# Patient Record
Sex: Male | Born: 1990 | Race: White | Hispanic: No | Marital: Married | Smoking: Current every day smoker
Health system: Southern US, Community
[De-identification: ages and names within clinical notes are randomized; demographics above are authoritative.]

## PROBLEM LIST (undated history)

## (undated) DIAGNOSIS — F913 Oppositional defiant disorder: Secondary | ICD-10-CM

## (undated) DIAGNOSIS — F988 Other specified behavioral and emotional disorders with onset usually occurring in childhood and adolescence: Secondary | ICD-10-CM

## (undated) HISTORY — PX: WRIST ARTHROPLASTY: SHX1088

---
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
## 2012-04-19 ENCOUNTER — Ambulatory Visit: Payer: Self-pay | Admitting: Family Medicine

## 2013-07-12 ENCOUNTER — Emergency Department: Payer: Self-pay | Admitting: Emergency Medicine

## 2014-10-03 ENCOUNTER — Emergency Department: Payer: Self-pay

## 2014-10-03 ENCOUNTER — Emergency Department
Admission: EM | Admit: 2014-10-03 | Discharge: 2014-10-03 | Disposition: A | Discharge status: Home / Self Care | Payer: Self-pay | Attending: Emergency Medicine | Admitting: Emergency Medicine

## 2014-10-03 ENCOUNTER — Encounter: Payer: Self-pay | Admitting: Emergency Medicine

## 2014-10-03 DIAGNOSIS — Z720 Tobacco use: Secondary | ICD-10-CM | POA: Insufficient documentation

## 2014-10-03 DIAGNOSIS — Y990 Civilian activity done for income or pay: Secondary | ICD-10-CM | POA: Insufficient documentation

## 2014-10-03 DIAGNOSIS — W11XXXA Fall on and from ladder, initial encounter: Secondary | ICD-10-CM | POA: Insufficient documentation

## 2014-10-03 DIAGNOSIS — S93402A Sprain of unspecified ligament of left ankle, initial encounter: Secondary | ICD-10-CM | POA: Insufficient documentation

## 2014-10-03 DIAGNOSIS — Y9389 Activity, other specified: Secondary | ICD-10-CM | POA: Insufficient documentation

## 2014-10-03 DIAGNOSIS — Y9289 Other specified places as the place of occurrence of the external cause: Secondary | ICD-10-CM | POA: Insufficient documentation

## 2014-10-03 MED ORDER — KETOROLAC TROMETHAMINE 10 MG PO TABS
ORAL_TABLET | ORAL | Status: AC
  Filled 2014-10-03: qty 1

## 2014-10-03 MED ORDER — IBUPROFEN 800 MG PO TABS: 800.0000 mg | ORAL_TABLET | Freq: Three times a day (TID) | ORAL | Status: DC | PRN

## 2014-10-03 MED ORDER — TRAMADOL HCL 50 MG PO TABS: 50.0000 mg | ORAL_TABLET | Freq: Four times a day (QID) | ORAL | Status: DC | PRN

## 2014-10-03 MED ORDER — KETOROLAC TROMETHAMINE 10 MG PO TABS
10.0000 mg | ORAL_TABLET | Freq: Once | ORAL | Status: AC
  Administered 2014-10-03: 10 mg via ORAL

## 2014-10-03 NOTE — ED Provider Notes (Signed)
[facility] Emergency Department Provider Note  ____________________________________________  Time seen: 1238  I have reviewed the triage vital signs and the nursing notes.   HISTORY  Chief Complaint Ankle Pain    HPI Dave Rivera is a 24 y.o. male arrives here today with left ankle painstates that he fell off a ladder where he stepped down one step to many while at work stepped into a ditch when he came down twisting his ankle felt a crack and a pop continue to work on it now it's swollen and bruised rates his pain as approximately a 5 out of 10 worse with any type of weightbearing or movement relieved by keeping it elevated denies numbness tingling or weakness otherwise has no complaints at this time is here today for further evaluation and treatment   History reviewed. No pertinent past medical history.  There are no active problems to display for this patient.   Past Surgical History  Procedure Laterality Date  . Wrist arthroplasty      Current Outpatient Rx  Name  Route  Sig  Dispense  Refill  . ibuprofen (ADVIL,MOTRIN) 800 MG tablet   Oral   Take 1 tablet (800 mg total) by mouth every 8 (eight) hours as needed.   30 tablet   0   . traMADol (ULTRAM) 50 MG tablet   Oral   Take 1 tablet (50 mg total) by mouth every 6 (six) hours as needed for moderate pain or severe pain.   12 tablet   0     Allergies Review of patient's allergies indicates no known allergies.  History reviewed. No pertinent family history.  Social History History  Substance Use Topics  . Smoking status: Current Every Day Smoker  . Smokeless tobacco: Not on file  . Alcohol Use: No    Review of Systems Constitutional: No fever/chills Eyes: No visual changes. ENT: No sore throat. Cardiovascular: Denies chest pain. Respiratory: Denies shortness of breath. Gastrointestinal: No abdominal pain.  No nausea, no vomiting.  No diarrhea.  No  constipation. Genitourinary: Negative for dysuria. Musculoskeletal: Negative for back pain. Skin: Negative for rash. Neurological: Negative for headaches, focal weakness or numbness.  10-point ROS otherwise negative.  ____________________________________________   PHYSICAL EXAM:  VITAL SIGNS: ED Triage Vitals  Enc Vitals Group     BP 10/03/14 1153 114/66 mmHg     Pulse --      Resp 10/03/14 1153 16     Temp 10/03/14 1153 97.9 F (36.6 C)     Temp Source 10/03/14 1153 Oral     SpO2 10/03/14 1153 98 %     Weight 10/03/14 1153 200 lb (90.719 kg)     Height 10/03/14 1153 6' (1.829 m)     Head Cir --      Peak Flow --      Pain Score 10/03/14 1154 5     Pain Loc --      Pain Edu? --      Excl. in GC? --     Constitutional: Alert and oriented. Well appearing and in no acute distress. Eyes: Conjunctivae are normal. PERRL. EOMI. Head: Atraumatic. Nose: No congestion/rhinnorhea. Mouth/Throat: Mucous membranes are moist.  Oropharynx non-erythematous. Neck: No stridor.   Cardiovascular: Normal rate, regular rhythm. Grossly normal heart sounds.  Good peripheral circulation. Respiratory: Normal respiratory effort.  No retractions. Lungs CTAB. Musculoskeletal: Tenderness with palpation over his lateral malleolus of his left ankle bruising below that mild swelling across the  top of his left foot he has good distal pulses good cap refill Achilles is intact Neurologic:  Normal speech and language. No gross focal neurologic deficits are appreciated. Speech is normal. No gait instability. Skin:  Skin is warm, dry and intact. No rash noted. Psychiatric: Mood and affect are normal. Speech and behavior are normal.  ____________________________________________    RADIOLOGY  IMPRESSION: No acute osseous abnormalities.   Electronically Signed By: Ulyses Southward M.D. On: 10/03/2014 13:26  I, Liesa Tsan,  Leelynd Maldonado, Kristine Garbe, personally viewed and evaluated these images as part of my medical  decision making.  ____________________________________________   PROCEDURES  Procedure(s) performed: Ace wrap and stirrup splint was applied to the patient's ankle  Critical Care performed: No  ____________________________________________   INITIAL IMPRESSION / ASSESSMENT AND PLAN / ED COURSE  Pertinent labs & imaging results that were available during my care of the patient were reviewed by me and considered in my medical decision making (see chart for details).  Initial impression left ankle sprain patient recommend [facility] follow-up with orthopedics in 1 week if he has continued pain return here for any acute concerns or worsening symptoms ____________________________________________   FINAL CLINICAL IMPRESSION(S) / ED DIAGNOSES  Final diagnoses:  Ankle sprain, left, initial encounter     Jacen Carlini Rosalyn Gess, PA-C 10/03/14 1358  Emily Filbert, MD 10/03/14 1426

## 2014-10-03 NOTE — Discharge Instructions (Signed)

## 2014-10-03 NOTE — ED Notes (Signed)
Reports twisting ankle at work 2 days ago.  States he was at work but does not want to file WC.

## 2015-02-10 ENCOUNTER — Emergency Department
Admission: EM | Admit: 2015-02-10 | Discharge: 2015-02-10 | Disposition: A | Discharge status: Home / Self Care | Payer: Self-pay | Attending: Emergency Medicine | Admitting: Emergency Medicine

## 2015-02-10 ENCOUNTER — Encounter: Payer: Self-pay | Admitting: Emergency Medicine

## 2015-02-10 DIAGNOSIS — K644 Residual hemorrhoidal skin tags: Secondary | ICD-10-CM | POA: Insufficient documentation

## 2015-02-10 DIAGNOSIS — Z79899 Other long term (current) drug therapy: Secondary | ICD-10-CM | POA: Insufficient documentation

## 2015-02-10 DIAGNOSIS — Z720 Tobacco use: Secondary | ICD-10-CM | POA: Insufficient documentation

## 2015-02-10 DIAGNOSIS — K625 Hemorrhage of anus and rectum: Secondary | ICD-10-CM

## 2015-02-10 LAB — URINALYSIS COMPLETE WITH MICROSCOPIC (ARMC ONLY)
Bacteria, UA: NONE SEEN
Bilirubin Urine: NEGATIVE
GLUCOSE, UA: NEGATIVE mg/dL
Hgb urine dipstick: NEGATIVE
KETONES UR: NEGATIVE mg/dL
Leukocytes, UA: NEGATIVE
Nitrite: NEGATIVE
Protein, ur: 100 mg/dL — AB
SQUAMOUS EPITHELIAL / LPF: NONE SEEN
Specific Gravity, Urine: 1.02 (ref 1.005–1.030)
pH: 9 — ABNORMAL HIGH (ref 5.0–8.0)

## 2015-02-10 LAB — COMPREHENSIVE METABOLIC PANEL
ALK PHOS: 80 U/L (ref 38–126)
ALT: 15 U/L — ABNORMAL LOW (ref 17–63)
AST: 21 U/L (ref 15–41)
Albumin: 4.6 g/dL (ref 3.5–5.0)
Anion gap: 8 (ref 5–15)
BUN: 10 mg/dL (ref 6–20)
CALCIUM: 9.1 mg/dL (ref 8.9–10.3)
CO2: 27 mmol/L (ref 22–32)
Chloride: 104 mmol/L (ref 101–111)
Creatinine, Ser: 0.7 mg/dL (ref 0.61–1.24)
GFR calc non Af Amer: >60 mL/min (ref >60)
Glucose, Bld: 133 mg/dL — ABNORMAL HIGH (ref 65–99)
Potassium: 3.9 mmol/L (ref 3.5–5.1)
Sodium: 139 mmol/L (ref 135–145)
Total Bilirubin: 0.8 mg/dL (ref 0.3–1.2)
Total Protein: 7.9 g/dL (ref 6.5–8.1)

## 2015-02-10 LAB — CBC
HCT: 45.5 % (ref 40.0–52.0)
Hemoglobin: 15.3 g/dL (ref 13.0–18.0)
MCH: 29.6 pg (ref 26.0–34.0)
MCHC: 33.6 g/dL (ref 32.0–36.0)
MCV: 88.2 fL (ref 80.0–100.0)
PLATELETS: 269 10*3/uL (ref 150–440)
RBC: 5.16 MIL/uL (ref 4.40–5.90)
RDW: 12.9 % (ref 11.5–14.5)
WBC: 11.5 10*3/uL — ABNORMAL HIGH (ref 3.8–10.6)

## 2015-02-10 LAB — LIPASE, BLOOD: Lipase: 51 U/L (ref 11–51)

## 2015-02-10 MED ORDER — DOCUSATE SODIUM 100 MG PO CAPS: 200.0000 mg | ORAL_CAPSULE | Freq: Two times a day (BID) | ORAL | Status: DC

## 2015-02-10 NOTE — ED Notes (Signed)
Yesterday had normal bm, last pm developed pain low abd pain, vomited several times,had episode of rectal bleeding,

## 2015-02-10 NOTE — Discharge Instructions (Signed)
°Gastrointestinal Bleeding °Gastrointestinal (GI) bleeding means there is bleeding somewhere along the digestive tract, between the mouth and anus. °CAUSES  °There are many different problems that can cause GI bleeding. Possible causes include: °· Esophagitis. This is inflammation, irritation, or swelling of the esophagus. °· Hemorrhoids. These are veins that are full of blood (engorged) in the rectum. They cause pain, inflammation, and may bleed. °· Anal fissures. These are areas of painful tearing which may bleed. They are often caused by passing hard stool. °· Diverticulosis. These are pouches that form on the colon over time, with age, and may bleed significantly. °· Diverticulitis. This is inflammation in areas with diverticulosis. It can cause pain, fever, and bloody stools, although bleeding is rare. °· Polyps and cancer. Colon cancer often starts out as precancerous polyps. °· Gastritis and ulcers. Bleeding from the upper gastrointestinal tract (near the stomach) may travel through the intestines and produce black, sometimes tarry, often bad smelling stools. In certain cases, if the bleeding is fast enough, the stools may not be black, but red. This condition may be life-threatening. °SYMPTOMS  °· Vomiting bright red blood or material that looks like coffee grounds. °· Bloody, black, or tarry stools. °DIAGNOSIS  °Your caregiver may diagnose your condition by taking your history and performing a physical exam. More tests may be needed, including: °· X-rays and other imaging tests. °· Esophagogastroduodenoscopy (EGD). This test uses a flexible, lighted tube to look at your esophagus, stomach, and small intestine. °· Colonoscopy. This test uses a flexible, lighted tube to look at your colon. °TREATMENT  °Treatment depends on the cause of your bleeding.  °· For bleeding from the esophagus, stomach, small intestine, or colon, the caregiver doing your EGD or colonoscopy may be able to stop the bleeding as part of  the procedure. °· Inflammation or infection of the colon can be treated with medicines. °· Many rectal problems can be treated with creams, suppositories, or warm baths. °· Surgery is sometimes needed. °· Blood transfusions are sometimes needed if you have lost a lot of blood. °If bleeding is slow, you may be allowed to go home. If there is a lot of bleeding, you will need to stay in the hospital for observation. °HOME CARE INSTRUCTIONS  °· Take any medicines exactly as prescribed. °· Keep your stools soft by eating foods that are high in fiber. These foods include whole grains, legumes, fruits, and vegetables. Prunes (1 to 3 a day) work well for many people. °· Drink enough fluids to keep your urine clear or pale yellow. °SEEK IMMEDIATE MEDICAL CARE IF:  °· Your bleeding increases. °· You feel lightheaded, weak, or you faint. °· You have severe cramps in your back or abdomen. °· You pass large blood clots in your stool. °· Your problems are getting worse. °MAKE SURE YOU:  °· Understand these instructions. °· Will watch your condition. °· Will get help right away if you are not doing well or get worse. °  °This information is not intended to replace advice given to you by your health care provider. Make sure you discuss any questions you have with your health care provider. °  °Document Released: 03/26/2000 Document Revised: 03/15/2012 Document Reviewed: 09/16/2014 °Elsevier Interactive Patient Education ©2016 [employer] °Hemorrhoids °Hemorrhoids are swollen veins around the rectum or anus. There are two types of hemorrhoids:  °· Internal hemorrhoids. These occur in the veins just inside the rectum. They may poke through to the outside and become irritated and painful. °· External hemorrhoids. These occur in   the veins outside the anus and can be felt as a painful swelling or hard lump near the anus. °CAUSES °· Pregnancy.   °· Obesity.   °· Constipation or diarrhea.   °· Straining to have a bowel movement.    °· Sitting for long periods on the toilet. °· Heavy lifting or other activity that caused you to strain. °· Anal intercourse. °SYMPTOMS  °· Pain.   °· Anal itching or irritation.   °· Rectal bleeding.   °· Fecal leakage.   °· Anal swelling.   °· One or more lumps around the anus.   °DIAGNOSIS  °Your caregiver may be able to diagnose hemorrhoids by visual examination. Other examinations or tests that may be performed include:  °· Examination of the rectal area with a gloved hand (digital rectal exam).   °· Examination of anal canal using a small tube (scope).   °· A blood test if you have lost a significant amount of blood. °· A test to look inside the colon (sigmoidoscopy or colonoscopy). °TREATMENT °Most hemorrhoids can be treated at home. However, if symptoms do not seem to be getting better or if you have a lot of rectal bleeding, your caregiver may perform a procedure to help make the hemorrhoids get smaller or remove them completely. Possible treatments include:  °· Placing a rubber band at the base of the hemorrhoid to cut off the circulation (rubber band ligation).   °· Injecting a chemical to shrink the hemorrhoid (sclerotherapy).   °· Using a tool to burn the hemorrhoid (infrared light therapy).   °· Surgically removing the hemorrhoid (hemorrhoidectomy).   °· Stapling the hemorrhoid to block blood flow to the tissue (hemorrhoid stapling).   °HOME CARE INSTRUCTIONS  °· Eat foods with fiber, such as whole grains, beans, nuts, fruits, and vegetables. Ask your doctor about taking products with added fiber in them (fiber supplements). °· Increase fluid intake. Drink enough water and fluids to keep your urine clear or pale yellow.   °· Exercise regularly.   °· Go to the bathroom when you have the urge to have a bowel movement. Do not wait.   °· Avoid straining to have bowel movements.   °· Keep the anal area dry and clean. Use wet toilet paper or moist towelettes after a bowel movement.   °· Medicated creams  and suppositories may be used or applied as directed.   °· Only take over-the-counter or prescription medicines as directed by your caregiver.   °· Take warm sitz baths for 15-20 minutes, 3-4 times a day to ease pain and discomfort.   °· Place ice packs on the hemorrhoids if they are tender and swollen. Using ice packs between sitz baths may be helpful.   °· Put ice in a plastic bag.   °· Place a towel between your skin and the bag.   °· Leave the ice on for 15-20 minutes, 3-4 times a day.   °· Do not use a donut-shaped pillow or sit on the toilet for long periods. This increases blood pooling and pain.   °SEEK MEDICAL CARE IF: °· You have increasing pain and swelling that is not controlled by treatment or medicine. °· You have uncontrolled bleeding. °· You have difficulty or you are unable to have a bowel movement. °· You have pain or inflammation outside the area of the hemorrhoids. °MAKE SURE YOU: °· Understand these instructions. °· Will watch your condition. °· Will get help right away if you are not doing well or get worse. °  °This information is not intended to replace advice given to you by your health care provider. Make sure you discuss any questions you have   with your health care provider. °  °Document Released: 03/26/2000 Document Revised: 03/15/2012 Document Reviewed: 02/01/2012 °Elsevier Interactive Patient Education ©2016 [employer] ° °

## 2015-02-10 NOTE — ED Notes (Signed)
Pt to ed with c/o abd pain since last night,  Pt states last bm this am,  Reports vomiting last night.

## 2015-02-10 NOTE — ED Notes (Signed)
MD at bedside. 

## 2015-02-10 NOTE — ED Provider Notes (Signed)
[facility] Emergency Department Provider Note  ____________________________________________  Time seen: 9:30 AM  I have reviewed the triage vital signs and the nursing notes.   HISTORY  Chief Complaint Abdominal Pain    HPI Dave Mctavish. is a 23 y.o. male who reports being constipated recently. He had 3 large bowel movements yesterday, after which she started developing some crampy lower abdominal pain. He also was nauseated and vomited 2-3 times without blood in it. After passing several hard bowel movements, he also noted that he had a small amount of red blood on the toilet paper and in the toilet bowl. He has had a history of hemorrhoids before.no significant intra-abdominal surgeries in the past.     History reviewed. No pertinent past medical history.   There are no active problems to display for this patient.    Past Surgical History  Procedure Laterality Date  . Wrist arthroplasty       Current Outpatient Rx  Name  Route  Sig  Dispense  Refill  . docusate sodium (COLACE) 100 MG capsule   Oral   Take 2 capsules (200 mg total) by mouth 2 (two) times daily.   120 capsule   0   . ibuprofen (ADVIL,MOTRIN) 800 MG tablet   Oral   Take 1 tablet (800 mg total) by mouth every 8 (eight) hours as needed. Patient not taking: Reported on 02/10/2015   30 tablet   0   . traMADol (ULTRAM) 50 MG tablet   Oral   Take 1 tablet (50 mg total) by mouth every 6 (six) hours as needed for moderate pain or severe pain. Patient not taking: Reported on 02/10/2015   12 tablet   0      Allergies Review of patient's allergies indicates no known allergies.   History reviewed. No pertinent family history.  Social History Social History  Substance Use Topics  . Smoking status: Current Every Day Smoker  . Smokeless tobacco: None  . Alcohol Use: No    Review of Systems  Constitutional:   No fever or chills. No weight changes Eyes:   No  blurry vision or double vision.  ENT:   No sore throat. Cardiovascular:   No chest pain. Respiratory:   No dyspnea or cough. Gastrointestinal:  Abdominal pain with vomiting as above, no diarrhea. Positive bright red blood per rectum  no melena. Genitourinary:   Negative for dysuria, urinary retention, bloody urine, or difficulty urinating. Musculoskeletal:   Negative for back pain. No joint swelling or pain. Skin:   Negative for rash. Neurological:   Negative for headaches, focal weakness or numbness. Psychiatric:  No anxiety or depression.   Endocrine:  No hot/cold intolerance, changes in energy, or sleep difficulty.  10-point ROS otherwise negative.  ____________________________________________   PHYSICAL EXAM:  VITAL SIGNS: ED Triage Vitals  Enc Vitals Group     BP 02/10/15 0920 116/71 mmHg     Pulse Rate 02/10/15 0920 79     Resp 02/10/15 0920 18     Temp 02/10/15 0920 98.3 F (36.8 C)     Temp src --      SpO2 02/10/15 0920 97 %     Weight 02/10/15 0917 200 lb (90.719 kg)     Height 02/10/15 0917  (1.854 m)     Head Cir --      Peak Flow --      Pain Score 02/10/15 0917 0     Pain Loc --  Pain Edu? --      Excl. in GC? --      Constitutional:   Alert and oriented. Well appearing and in no distress. Eyes:   No scleral icterus. No conjunctival pallor. PERRL. EOMI ENT   Head:   Normocephalic and atraumatic.   Nose:   No congestion/rhinnorhea. No septal hematoma   Mouth/Throat:   MMM, no pharyngeal erythema. No peritonsillar mass. No uvula shift.   Neck:   No stridor. No SubQ emphysema. No meningismus. Hematological/Lymphatic/Immunilogical:   No cervical lymphadenopathy. Cardiovascular:   RRR. Normal and symmetric distal pulses are present in all extremities. No murmurs, rubs, or gallops. Respiratory:   Normal respiratory effort without tachypnea nor retractions. Breath sounds are clear and equal bilaterally. No  wheezes/rales/rhonchi. Gastrointestinal:   Soft and nontender. No distention. There is no CVA tenderness.  No rebound, rigidity, or guarding. Rectal exam is normal externally. On digital rectal exam there is palpable external hemorrhoid that is not tender. Prostate unremarkable. Exam produces only secretions that are trace positive for Hemoccult Genitourinary:   deferred Musculoskeletal:   Nontender with normal range of motion in all extremities. No joint effusions.  No lower extremity tenderness.  No edema. Neurologic:   Normal speech and language.  CN 2-10 normal. Motor grossly intact. No pronator drift.  Normal gait. No gross focal neurologic deficits are appreciated.  Skin:    Skin is warm, dry and intact. No rash noted.  No petechiae, purpura, or bullae. Psychiatric:   Mood and affect are normal. Speech and behavior are normal. Patient exhibits appropriate insight and judgment.  ____________________________________________    LABS (pertinent positives/negatives) (all labs ordered are listed, but only abnormal results are displayed) Labs Reviewed  COMPREHENSIVE METABOLIC PANEL - Abnormal; Notable for the following:    Glucose, Bld 133 (*)    ALT 15 (*)    All other components within normal limits  CBC - Abnormal; Notable for the following:    WBC 11.5 (*)    All other components within normal limits  URINALYSIS COMPLETEWITH MICROSCOPIC ([facility] ONLY) - Abnormal; Notable for the following:    Color, Urine YELLOW (*)    APPearance TURBID (*)    pH 9.0 (*)    Protein, ur 100 (*)    All other components within normal limits  LIPASE, BLOOD   ____________________________________________   EKG    ____________________________________________    RADIOLOGY    ____________________________________________   PROCEDURES   ____________________________________________   INITIAL IMPRESSION / ASSESSMENT AND PLAN / ED COURSE  Pertinent labs & imaging results that were  available during my care of the patient were reviewed by me and considered in my medical decision making (see chart for details).  Patient presents with abdominal pain and passing some bright red blood per rectum after having several large hard bowel movements. He does appear to have an external hemorrhoid which likely is abraded and bleeding after the mechanical trauma from large bowel movements. We'll keep him on Colace for stool softening and have him take sitz baths and do conservative hemorrhoid management, and I checked his symptoms were resolved. If not his been counseled to follow up with GI for further evaluation. Labs unremarkable, vital signs normal.     ____________________________________________   FINAL CLINICAL IMPRESSION(S) / ED DIAGNOSES  Final diagnoses:  External hemorrhoid  Rectal bleed      Sharman CheekPhillip Breckin [place], MD 02/10/15 1112

## 2015-03-20 ENCOUNTER — Encounter: Payer: Self-pay | Admitting: Emergency Medicine

## 2015-03-20 ENCOUNTER — Ambulatory Visit
Admission: EM | Admit: 2015-03-20 | Discharge: 2015-03-20 | Disposition: A | Discharge status: Home / Self Care | Payer: Self-pay | Attending: Family Medicine | Admitting: Family Medicine

## 2015-03-20 DIAGNOSIS — J069 Acute upper respiratory infection, unspecified: Secondary | ICD-10-CM

## 2015-03-20 DIAGNOSIS — R040 Epistaxis: Secondary | ICD-10-CM

## 2015-03-20 MED ORDER — LORATADINE-PSEUDOEPHEDRINE ER 5-120 MG PO TB12: 1.0000 | ORAL_TABLET | Freq: Two times a day (BID) | ORAL | Status: DC

## 2015-03-20 NOTE — Discharge Instructions (Signed)
take medication as prescribed. Rest. Drink plenty of fluids. Take over-the-counter Tylenol or ibuprofen as needed.  Follow-up closely with her primary care physician or the above. Return to urgent care or proceed to ER for new or worsening concerns.  Upper Respiratory Infection, Adult Most upper respiratory infections (URIs) are caused by a virus. A URI affects the nose, throat, and upper air passages. The most common type of URI is often called "the common cold." HOME CARE   Take medicines only as told by your doctor.  Gargle warm saltwater or take cough drops to comfort your throat as told by your doctor.  Use a warm mist humidifier or inhale steam from a shower to increase air moisture. This may make it easier to breathe.  Drink enough fluid to keep your pee (urine) clear or pale yellow.  Eat soups and other clear broths.  Have a healthy diet.  Rest as needed.  Go back to work when your fever is gone or your doctor says it is okay.  You may need to stay home longer to avoid giving your URI to others.  You can also wear a face mask and wash your hands often to prevent spread of the virus.  Use your inhaler more if you have asthma.  Do not use any tobacco products, including cigarettes, chewing tobacco, or electronic cigarettes. If you need help quitting, ask your doctor. GET HELP IF:  You are getting worse, not better.  Your symptoms are not helped by medicine.  You have chills.  You are getting more short of breath.  You have brown or red mucus.  You have yellow or brown discharge from your nose.  You have pain in your face, especially when you bend forward.  You have a fever.  You have puffy (swollen) neck glands.  You have pain while swallowing.  You have white areas in the back of your throat. GET HELP RIGHT AWAY IF:   You have very bad or constant:  Headache.  Ear pain.  Pain in your forehead, behind your eyes, and over your cheekbones (sinus  pain).  Chest pain.  You have long-lasting (chronic) lung disease and any of the following:  Wheezing.  Long-lasting cough.  Coughing up blood.  A change in your usual mucus.  You have a stiff neck.  You have changes in your:  Vision.  Hearing.  Thinking.  Mood. MAKE SURE YOU:   Understand these instructions.  Will watch your condition.  Will get help right away if you are not doing well or get worse.   This information is not intended to replace advice given to you by your health care provider. Make sure you discuss any questions you have with your health care provider.   Document Released: 09/15/2007 Document Revised: 08/13/2014 Document Reviewed: 07/04/2013 Elsevier Interactive Patient Education 2016 [employer].  Nosebleed Nosebleeds are common. They are due to a crack in the inside lining of your nose (mucous membrane) or from a small blood vessel that starts to bleed. Nosebleeds can be caused by many conditions, such as injury, infections, dry mucous membranes or dry climate, medicines, nose picking, and home heating and cooling systems. Most nosebleeds come from blood vessels in the front of your nose. HOME CARE INSTRUCTIONS   Try controlling your nosebleed by pinching your nostrils gently and continuously for at least 10 minutes.  Avoid blowing or sniffing your nose for a number of hours after having a nosebleed.  Do not put gauze inside  your nose yourself. If your nose was packed by your health care provider, try to maintain the pack inside of your nose until your health care provider removes it.  If a gauze pack was used and it starts to fall out, gently replace it or cut off the end of it.  If a balloon catheter was used to pack your nose, do not cut or remove it unless your health care provider has instructed you to do that.  Avoid lying down while you are having a nosebleed. Sit up and lean forward.  Use a nasal spray decongestant to help with a  nosebleed as directed by your health care provider.  Do not use petroleum jelly or mineral oil in your nose. These can drip into your lungs.  Maintain humidity in your home by using less air conditioning or by using a humidifier.  Aspirinand blood thinners make bleeding more likely. If you are prescribed these medicines and you suffer from nosebleeds, ask your health care provider if you should stop taking the medicines or adjust the dose. Do not stop medicines unless directed by your health care provider  Resume your normal activities as you are able, but avoid straining, lifting, or bending at the waist for several days.  If your nosebleed was caused by dry mucous membranes, use over-the-counter saline nasal spray or gel. This will keep the mucous membranes moist and allow them to heal. If you must use a lubricant, choose the water-soluble variety. Use it only sparingly, and do not use it within several hours of lying down.  Keep all follow-up visits as directed by your health care provider. This is important. SEEK MEDICAL CARE IF:  You have a fever.  You get frequent nosebleeds.  You are getting nosebleeds more often. SEEK IMMEDIATE MEDICAL CARE IF:  Your nosebleed lasts longer than 20 minutes.  Your nosebleed occurs after an injury to your face, and your nose looks crooked or broken.  You have unusual bleeding from other parts of your body.  You have unusual bruising on other parts of your body.  You feel light-headed or you faint.  You become sweaty.  You vomit blood.  Your nosebleed occurs after a head injury.   This information is not intended to replace advice given to you by your health care provider. Make sure you discuss any questions you have with your health care provider.   Document Released: 01/06/2005 Document Revised: 04/19/2014 Document Reviewed: 11/12/2013 Elsevier Interactive Patient Education [employer] Elsevier Inc.

## 2015-03-20 NOTE — ED Provider Notes (Signed)
[facility]  ____________________________________________  Time seen: Approximately 1740PM  I have reviewed the triage vital signs and the nursing notes.   HISTORY  Chief Complaint Facial Pain   HPI Dave NettleJames W Alberty Jr. is a 24 y.o. male presents for complaints of 2 days of runny nose, nasal congestion. Patient states that he took over-the-counter Sudafed once this morning and states that this fully resolved his complaints.. Patient states that he thinks he had a fever this morning as he felt very warm and states that he checked his fever and was 101 orally. Patient reports occasional cough. Patient states that he continues to eat and drink fluids well. Denies sore throat. Denies chest pain, shortness of breath, wheezing, abdominal pain, neck pain, back pain, facial pain, headache, dizziness.  Patient reports that he came in tonight because he needed a work note as he called out of work [employer]. Patient states that overall he feels fine but states he needs a work note so he does not get fired.  Patient also reports that he wants his nose to be checked. 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 Patient states that he was wearing safety glasses. Denies eye involvement, eye pain, foreign body sensation, vision changes. Patient states that he leaned over and a piece of a stick went directly into his right nare. Patient states that he did have a small amount of a nosebleed immediately afterwards that quickly resolved. Patient reports that he wanted his nose checked to make sure there was no cut inside his nose. Denies other nasal bleeding. States no nasal bleeding when blowing nose. Denies other nasal bleeding. Denies other abnormal bleeding. Denies nasal or facial pain. Denies fall, head injury or loss of consciousness.   History reviewed. No pertinent past medical history.  There are no active problems to display for this patient.   Past Surgical History   Procedure Laterality Date  . Wrist arthroplasty      Current Outpatient Rx  Name  Route  Sig  Dispense  Refill  .           .           .           .             Allergies Review of patient's allergies indicates no known allergies.  No family history on file.  Social History Social History  Substance Use Topics  . Smoking status: Current Every Day Smoker  . Smokeless tobacco: None  . Alcohol Use: No    Reports tetanus immunization is up-to-date.  Review of Systems Constitutional: No fever/chills Eyes: No visual changes. ENT: No sore throat. Positive runny nose, nasal congestion and reports occasional cough. Reports posttraumatic nosebleed from yesterday. Cardiovascular: Denies chest pain. Respiratory: Denies shortness of breath. Gastrointestinal: No abdominal pain.  No nausea, no vomiting.  No diarrhea.  No constipation. Genitourinary: Negative for dysuria. Musculoskeletal: Negative for back pain. Skin: Negative for rash. Neurological: Negative for headaches, focal weakness or numbness.  10-point ROS otherwise negative.  ____________________________________________   PHYSICAL EXAM:  VITAL SIGNS: ED Triage Vitals  Enc Vitals Group     BP 03/20/15 1645 127/67 mmHg     Pulse Rate 03/20/15 1645 84     Resp 03/20/15 1645 18     Temp 03/20/15 1645 97.8 F (36.6 C)     Temp src -- oral     SpO2 03/20/15 1645 97 %  Weight 03/20/15 1645 210 lb (95.255 kg)     Height 03/20/15 1645  (1.854 m)     Head Cir --      Peak Flow --      Pain Score --      Pain Loc --      Pain Edu? --      Excl. in GC? --     Constitutional: Alert and oriented. Well appearing and in no acute distress. Eyes: Conjunctivae are normal. PERRL. EOMI. no pain with EOMs. Head: Atraumatic. No sinus tenderness to palpation. No swelling. No erythema. No facial tenderness, ecchymosis or signs of trauma.  Ears: no erythema, normal TMs bilaterally.   Nose: Mild clear nasal rhinorrhea.  Nontender. No active nosebleed, no visualized dry blood, no signs of recent bleeding or trauma visualized. No external nasal tenderness.  Mouth/Throat: Mucous membranes are moist.  Oropharynx non-erythematous. No tonsillar swelling or exudate. Neck: No stridor.  No cervical spine tenderness to palpation. Hematological/Lymphatic/Immunilogical: No cervical lymphadenopathy. Cardiovascular: Normal rate, regular rhythm. Grossly normal heart sounds.  Good peripheral circulation. Respiratory: Normal respiratory effort.  No retractions. Lungs CTAB. No wheezes, rales or rhonchi. Good air movement. Gastrointestinal: Soft and nontender. No distention. Normal Bowel sounds.   Musculoskeletal: No lower or upper extremity tenderness nor edema.   Neurologic:  Normal speech and language. No gross focal neurologic deficits are appreciated. No gait instability. Skin:  Skin is warm, dry and intact. No rash noted. Psychiatric: Mood and affect are normal. Speech and behavior are normal.  ____________________________________________   LABS (all labs ordered are listed, but only abnormal results are displayed)  Labs Reviewed - No data to display ____________________________________________  INITIAL IMPRESSION / ASSESSMENT AND PLAN / ED COURSE  Pertinent labs & imaging results that were available during my care of the patient were reviewed by me and considered in my medical decision making (see chart for details).  Very well-appearing patient. No acute distress. Presents for the complaints of 2 days of runny nose, nasal congestion and occasional cough. Very well-appearing patient. Lungs clear throughout. Abdomen soft and nontender. Patient also reports yesterday he had a small piece of a stick go into his right nare after accidentally leaned forward near stick, and reports that this caused a brief nosebleed and he wanted evaluated. No facial or nasal tenderness. Nares patent. No signs of active or recent nosebleed.  Counseled regarding monitoring and supportive treatments. Suspect viral respiratory infection. Will treat supportively with when necessary Claritin-D, over-the-counter Tylenol or ibuprofen, encourage fluids and rest. Work note for today and tomorrow given.  Discussed follow up with Primary care physician this week. Discussed follow up and return parameters including no resolution or any worsening concerns. Patient verbalized understanding and agreed to plan.   ____________________________________________   FINAL CLINICAL IMPRESSION(S) / ED DIAGNOSES  Final diagnoses:  Upper respiratory infection  Epistaxis       Renford Dills, NP 03/20/15 1925

## 2015-03-20 NOTE — ED Notes (Signed)
Sinus pain, fever 101 this morning. Possible abrasion in nose .

## 2015-04-09 ENCOUNTER — Ambulatory Visit
Admission: EM | Admit: 2015-04-09 | Discharge: 2015-04-09 | Disposition: A | Discharge status: Home / Self Care | Payer: Self-pay | Attending: Family Medicine | Admitting: Family Medicine

## 2015-04-09 DIAGNOSIS — J9801 Acute bronchospasm: Secondary | ICD-10-CM

## 2015-04-09 DIAGNOSIS — H6503 Acute serous otitis media, bilateral: Secondary | ICD-10-CM

## 2015-04-09 HISTORY — DX: Other specified behavioral and emotional disorders with onset usually occurring in childhood and adolescence: F98.8

## 2015-04-09 HISTORY — DX: Oppositional defiant disorder: F91.3

## 2015-04-09 MED ORDER — ALBUTEROL SULFATE HFA 108 (90 BASE) MCG/ACT IN AERS: 2.0000 | INHALATION_SPRAY | Freq: Four times a day (QID) | RESPIRATORY_TRACT | Status: DC | PRN

## 2015-04-09 MED ORDER — AMOXICILLIN 875 MG PO TABS: 875.0000 mg | ORAL_TABLET | Freq: Two times a day (BID) | ORAL | Status: DC

## 2015-04-09 NOTE — ED Provider Notes (Signed)
CSN: [id number]     Arrival date & time 04/09/15  1638 History   First MD Initiated Contact with Patient 04/09/15 1829     Chief Complaint  Patient presents with  . URI   (Consider location/radiation/quality/duration/timing/severity/associated sxs/prior Treatment) Patient is a 24 y.o. male presenting with URI. The history is provided by the patient.  URI Presenting symptoms: congestion, cough, ear pain and rhinorrhea   Severity:  Moderate Onset quality:  Sudden Duration:  1 day Timing:  Constant Progression:  Worsening Chronicity:  New Ineffective treatments:  None tried   Past Medical History  Diagnosis Date  . ADD (attention deficit disorder)   . ODD (oppositional defiant disorder)    Past Surgical History  Procedure Laterality Date  . Wrist arthroplasty     Family History  Problem Relation Age of Onset  . Lupus Father    Social History  Substance Use Topics  . Smoking status: Current Every Day Smoker -- 1.00 packs/day    Types: Cigarettes  . Smokeless tobacco: None  . Alcohol Use: No    Review of Systems  HENT: Positive for congestion, ear pain and rhinorrhea.   Respiratory: Positive for cough.     Allergies  Review of patient's allergies indicates no known allergies.  Home Medications   Prior to Admission medications   Medication Sig Start Date End Date Taking? Authorizing Provider  albuterol (PROVENTIL HFA;VENTOLIN HFA) 108 (90 Base) MCG/ACT inhaler Inhale 2 puffs into the lungs every 6 (six) hours as needed for wheezing or shortness of breath. 04/09/15   Payton Mccallumrlando Avonell Lenig, MD  amoxicillin (AMOXIL) 875 MG tablet Take 1 tablet (875 mg total) by mouth 2 (two) times daily. 04/09/15   Payton Mccallumrlando Laaibah Wartman, MD  docusate sodium (COLACE) 100 MG capsule Take 2 capsules (200 mg total) by mouth 2 (two) times daily. 02/10/15   Sharman CheekPhillip Stafford, MD  ibuprofen (ADVIL,MOTRIN) 800 MG tablet Take 1 tablet (800 mg total) by mouth every 8 (eight) hours as needed. 10/03/14   III  William C Ruffian, PA-C  loratadine-pseudoephedrine (CLARITIN-D 12 HOUR) 5-120 MG tablet Take 1 tablet by mouth 2 (two) times daily. 03/20/15   Renford DillsLindsey Miller, NP  traMADol (ULTRAM) 50 MG tablet Take 1 tablet (50 mg total) by mouth every 6 (six) hours as needed for moderate pain or severe pain. Patient not taking: Reported on 02/10/2015 10/03/14   III Rosalyn GessWilliam C Ruffian, PA-C   Meds Ordered and Administered this Visit  Medications - No data to display  BP 131/69 mmHg  Pulse 93  Temp(Src) 98.3 F (36.8 C) (Tympanic)  Resp 18  Ht 6' (1.829 m)  Wt 210 lb (95.255 kg)  BMI 28.47 kg/m2  SpO2 99% No data found.   Physical Exam  Constitutional: He appears well-developed and well-nourished. No distress.  HENT:  Head: Normocephalic and atraumatic.  Right Ear: External ear and ear canal normal. Tympanic membrane is erythematous and bulging.  Left Ear: External ear and ear canal normal. Tympanic membrane is erythematous and bulging.  Nose: Rhinorrhea present.  Mouth/Throat: Uvula is midline and mucous membranes are normal. Posterior oropharyngeal erythema present. No oropharyngeal exudate, posterior oropharyngeal edema or tonsillar abscesses.  Eyes: Conjunctivae and EOM are normal. Pupils are equal, round, and reactive to light. Right eye exhibits no discharge. Left eye exhibits no discharge. No scleral icterus.  Neck: Normal range of motion. Neck supple. No tracheal deviation present. No thyromegaly present.  Cardiovascular: Normal rate, regular rhythm and normal heart sounds.   Pulmonary/Chest: Effort normal  and breath sounds normal. No stridor. No respiratory distress. He has no wheezes. He has no rales. He exhibits no tenderness.  Lymphadenopathy:    He has no cervical adenopathy.  Neurological: He is alert.  Skin: Skin is warm and dry. No rash noted. He is not diaphoretic.  Nursing note and vitals reviewed.   ED Course  Procedures (including critical care time)  Labs Review Labs  Reviewed - No data to display  Imaging Review No results found.   Visual Acuity Review  Right Eye Distance:   Left Eye Distance:   Bilateral Distance:    Right Eye Near:   Left Eye Near:    Bilateral Near:         MDM   1. Bilateral acute serous otitis media, recurrence not specified   2. Bronchospasm    New Prescriptions   ALBUTEROL (PROVENTIL HFA;VENTOLIN HFA) 108 (90 BASE) MCG/ACT INHALER    Inhale 2 puffs into the lungs every 6 (six) hours as needed for wheezing or shortness of breath.   AMOXICILLIN (AMOXIL) 875 MG TABLET    Take 1 tablet (875 mg total) by mouth 2 (two) times daily.   1. diagnosis reviewed with patient 2. rx as per orders above; reviewed possible side effects, interactions, risks and benefits  3. Recommend supportive treatment with rest, otc analgesics, increased fluids 4. Follow-up prn if symptoms worsen or don't improve    Payton Mccallum, MD 04/09/15 [phone]

## 2015-04-09 NOTE — ED Notes (Signed)
Started yesterday with general body aches, productive cough. Bilateral ear pressure

## 2017-03-08 ENCOUNTER — Ambulatory Visit
Admission: EM | Admit: 2017-03-08 | Discharge: 2017-03-08 | Disposition: A | Discharge status: Home / Self Care | Payer: Self-pay | Attending: Emergency Medicine | Admitting: Emergency Medicine

## 2017-03-08 ENCOUNTER — Other Ambulatory Visit: Payer: Self-pay

## 2017-03-08 DIAGNOSIS — S0911XA Strain of muscle and tendon of head, initial encounter: Secondary | ICD-10-CM

## 2017-03-08 DIAGNOSIS — Z23 Encounter for immunization: Secondary | ICD-10-CM

## 2017-03-08 DIAGNOSIS — R6884 Jaw pain: Secondary | ICD-10-CM

## 2017-03-08 MED ORDER — TETANUS-DIPHTH-ACELL PERTUSSIS 5-2.5-18.5 LF-MCG/0.5 IM SUSP
0.5000 mL | Freq: Once | INTRAMUSCULAR | Status: AC
  Administered 2017-03-08: 0.5 mL via INTRAMUSCULAR

## 2017-03-08 MED ORDER — CYCLOBENZAPRINE HCL 10 MG PO TABS: 10.0000 mg | ORAL_TABLET | Freq: Every day | ORAL | 0 refills | Status: DC

## 2017-03-08 MED ORDER — IBUPROFEN 600 MG PO TABS: 600.0000 mg | ORAL_TABLET | Freq: Four times a day (QID) | ORAL | 0 refills | Status: DC | PRN

## 2017-03-08 NOTE — ED Provider Notes (Signed)
HPI  SUBJECTIVE:  Dave NettleJames W Durbin Jr. is a 26 y.o. male who presents with constant, dull, achy, throbbing, sore left-sided jaw pain starting this morning.  He states it feels like muscle pain.  He reports some swelling.  No swelling inferior to the jaw or along the angle of the jaw.  He states that he does grind his teeth badly at night.  No dental pain, fevers, sore throat.  No neck or ear pain, trismus.  No muscle cramping, stiffness.  There are no alleviating factors.  He has not tried anything for this.  Symptoms are worse with yawning, sneezing, turning his head to the right.  He states that he scraped himself against a rusty nail 3 weeks ago, and his tetanus is not up-to-date.  He denies puncture wound.  He is a marijuana smoker, former tobacco smoker.  He does not take any medications on a regular basis.  No history of diabetes, hypertension, sialolithiasis or sialoadentitis.    Past Medical History:  Diagnosis Date  . ADD (attention deficit disorder)   . ODD (oppositional defiant disorder)     Past Surgical History:  Procedure Laterality Date  . WRIST ARTHROPLASTY      Family History  Problem Relation Age of Onset  . Lupus Father     Social History   Tobacco Use  . Smoking status: Former Smoker    Packs/day: 1.00    Types: Cigarettes  . Smokeless tobacco: Never Used  Substance Use Topics  . Alcohol use: No  . Drug use: Yes    Types: Marijuana    Comment: last use today    No current facility-administered medications for this encounter.   Current Outpatient Medications:  .  cyclobenzaprine (FLEXERIL) 10 MG tablet, Take 1 tablet (10 mg total) by mouth at bedtime., Disp: 20 tablet, Rfl: 0 .  docusate sodium (COLACE) 100 MG capsule, Take 2 capsules (200 mg total) by mouth 2 (two) times daily., Disp: 120 capsule, Rfl: 0 .  ibuprofen (ADVIL,MOTRIN) 600 MG tablet, Take 1 tablet (600 mg total) by mouth every 6 (six) hours as needed., Disp: 30 tablet, Rfl: 0  No Known  Allergies   ROS  As noted in HPI.   Physical Exam  BP 134/74 (BP Location: Right Arm)   Pulse 80   Temp 98.2 F (36.8 C) (Oral)   Resp 16   Ht 6\' 1"  (1.854 m)   Wt 202 lb (91.6 kg)   SpO2 98%   BMI 26.65 kg/m   Constitutional: Well developed, well nourished, no acute distress Eyes:  EOMI, conjunctiva normal bilaterally HENT: Normocephalic, atraumatic,mucus membranes moist.  Positive tenderness along the left masseter muscle.  No appreciable swelling.  Dentition normal.  Gums normal.  No expressible purulent drainage from [facility] or Stenton's duct.  No tenderness along the left submandibular salivary gland, no tenderness over the parotid gland.  No trismus.  TMs normal.  No tenderness, crepitus over the TMJ Neck: Positive single cervical lymph node, no other submandibular or cervical lymphadenopathy. Respiratory: Normal inspiratory effort Cardiovascular: Normal rate GI: nondistended skin: No rash, skin intact Musculoskeletal: no deformities Neurologic: Alert & oriented x 3, no focal neuro deficits Psychiatric: Speech and behavior appropriate   ED Course   Medications  Tdap (BOOSTRIX) injection 0.5 mL (0.5 mLs Intramuscular Given 03/08/17 1448)    No orders of the defined types were placed in this encounter.   No results found for this or any previous visit (from the past 24 hour(s)).  No results found.  ED Clinical Impression  Jaw pain  Strain of masseter muscle, initial encounter   ED Assessment/Plan  Tetanus unlikely, denies any other muscle spasms or rigidity.  Will update tetanus however.  Presentation was consistent with a strain of the masseter muscle.  Patient has tenderness along the masseter muscle and he does grind his teeth at night.  He has no tenderness along the parotid gland.  He does have some submandibular tenderness, but no palpable submandibular salivary gland.  Could be an early sialolithiasis/sialoadenitis but symptoms started today.  Plan to  send home with Flexeril, ibuprofen 600 mg 1 g of Tylenol 3-4 times a day, soft diet, will order primary care referral.  He will follow-up here for any signs of sialadenitis/sialithiasis and will go to the ER for any fevers, worsening of symptoms or other concerns  Discussed MDM, plan and followup with patient. Discussed sn/sx that should prompt return to the ED. patient agrees with plan.   Meds ordered this encounter  Medications  . Tdap (BOOSTRIX) injection 0.5 mL  . cyclobenzaprine (FLEXERIL) 10 MG tablet    Sig: Take 1 tablet (10 mg total) by mouth at bedtime.    Dispense:  20 tablet    Refill:  0  . ibuprofen (ADVIL,MOTRIN) 600 MG tablet    Sig: Take 1 tablet (600 mg total) by mouth every 6 (six) hours as needed.    Dispense:  30 tablet    Refill:  0    *This clinic note was created using Scientist, clinical (histocompatibility and immunogenetics)Dragon dictation software. Therefore, there may be occasional mistakes despite careful proofreading.   ?   Domenick GongMortenson, Peri Kreft, MD 03/08/17 1517

## 2017-03-08 NOTE — Discharge Instructions (Signed)
600 mg of ibuprofen with 1 g of Tylenol 3-4 times a day as needed for pain.  Soft diet for the next several days.  Try some warm compresses.  I have given you information on salivary gland stones and infections so that you know what to look out for.  I do not think that you have this at this time, but it is still a possibility.  Return here for any other signs or symptoms.  Here is a list of primary care providers who are taking new patients:  Dr. Elizabeth Sauereanna Jones, Dr. Schuyler AmorWilliam Plonk [address] [place] [address] [phone]  [facility] [place] [address]  [place] [address]  [phone]  [facility] [address]  [place], [place] [address] [phone]  [facility] [place] [address]  [phone] [place], [place] [address]  Here are clinics/ other resources who will see you if you do not have insurance. Some have certain criteria that you must meet. Call them and find out what they are:  [facility]: [address] [place] [place], [place] [address] Phone: [phone] Hours: First and Third Saturdays of each Month, 9 a.m. - 1 p.m.  [facility]: [address], [place], [place] [address] Phone: [phone] Hours: Tuesday, 4 p.m. - 8 p.m. Thursday, 1 p.m. - 8 p.m. Wednesday, 9 a.m. - [facility]  [facility] [address], [place], [place] [address] Phone: [phone] Pharmacy Phone Number: [phone] Dental Phone Number: [phone] [facility] Insurance Help: [phone]  Dental Hours: Monday - Thursday, 8 a.m. - 6 p.m.  [facility] Realharles [facility] [address] [place], [place] [address] Phone: [phone] Pharmacy Phone Number: [phone] [facility] Insurance Help: [phone]  [facility] [address] [place], [place] [address] Phone: [phone] Pharmacy Phone Number: [phone] [facility] Insurance Help: [phone]  [facility] [address] [place], [place] [address] Phone: [phone] [facility] Insurance Help: [phone]   [facility] [address] [place], [place] [address] Phone: [phone]  Go to www.goodrx.com to look up your medications. This will give you a list of where you can find your prescriptions at the most affordable prices. Or ask the pharmacist what the cash price is, or if they have any other discount programs available to help make your medication more affordable. This can be less expensive than what you would pay with insurance.

## 2017-03-08 NOTE — ED Triage Notes (Signed)
Pt reports left jaw line sore and tender, reports swelling around the area. Denies recent illness. Pain 6/10. Afebrile

## 2018-04-19 ENCOUNTER — Emergency Department
Admission: EM | Admit: 2018-04-19 | Discharge: 2018-04-19 | Disposition: A | Discharge status: Home / Self Care | Payer: Self-pay | Attending: Emergency Medicine | Admitting: Emergency Medicine

## 2018-04-19 ENCOUNTER — Emergency Department: Payer: Self-pay

## 2018-04-19 ENCOUNTER — Encounter: Payer: Self-pay | Admitting: Emergency Medicine

## 2018-04-19 ENCOUNTER — Other Ambulatory Visit: Payer: Self-pay

## 2018-04-19 DIAGNOSIS — Y998 Other external cause status: Secondary | ICD-10-CM | POA: Insufficient documentation

## 2018-04-19 DIAGNOSIS — Y9389 Activity, other specified: Secondary | ICD-10-CM | POA: Insufficient documentation

## 2018-04-19 DIAGNOSIS — Y9289 Other specified places as the place of occurrence of the external cause: Secondary | ICD-10-CM | POA: Insufficient documentation

## 2018-04-19 DIAGNOSIS — F17210 Nicotine dependence, cigarettes, uncomplicated: Secondary | ICD-10-CM | POA: Insufficient documentation

## 2018-04-19 DIAGNOSIS — W231XXA Caught, crushed, jammed, or pinched between stationary objects, initial encounter: Secondary | ICD-10-CM | POA: Insufficient documentation

## 2018-04-19 DIAGNOSIS — S46002A Unspecified injury of muscle(s) and tendon(s) of the rotator cuff of left shoulder, initial encounter: Secondary | ICD-10-CM | POA: Insufficient documentation

## 2018-04-19 MED ORDER — OXYCODONE-ACETAMINOPHEN 10-325 MG PO TABS: 1.0000 | ORAL_TABLET | Freq: Four times a day (QID) | ORAL | 0 refills | Status: AC | PRN

## 2018-04-19 MED ORDER — KETOROLAC TROMETHAMINE 30 MG/ML IJ SOLN
30.0000 mg | Freq: Once | INTRAMUSCULAR | Status: AC
  Administered 2018-04-19: 30 mg via INTRAMUSCULAR
  Filled 2018-04-19: qty 1

## 2018-04-19 MED ORDER — MELOXICAM 15 MG PO TABS: 15.0000 mg | ORAL_TABLET | Freq: Every day | ORAL | 0 refills | Status: DC

## 2018-04-19 MED ORDER — OXYCODONE HCL 5 MG PO TABS
5.0000 mg | ORAL_TABLET | Freq: Once | ORAL | Status: AC
  Administered 2018-04-19: 5 mg via ORAL
  Filled 2018-04-19: qty 1
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
## 2018-04-19 NOTE — ED Notes (Signed)
Pt is being discharged to home. Pt is AOx4, VSS, he does not show any signs of distress. AVS was given and explained to the pt and he verbalized understanding of all the information
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
## 2018-04-19 NOTE — Discharge Instructions (Signed)
Schedule an appointment with orthopedics. Return to the ER for symptoms that change or worsen if unable to schedule an appointment.

## 2018-04-19 NOTE — ED Notes (Signed)
Large arm sling applied to patients L arm.

## 2020-06-08 IMAGING — CR DG SHOULDER 2+V*L*
3 series · 3 of 3 positions shown · non-contrast
Comparison: None.

CLINICAL DATA: Acute pain

EXAM:
LEFT SHOULDER - 2+ VIEW

[shoulder grashey]
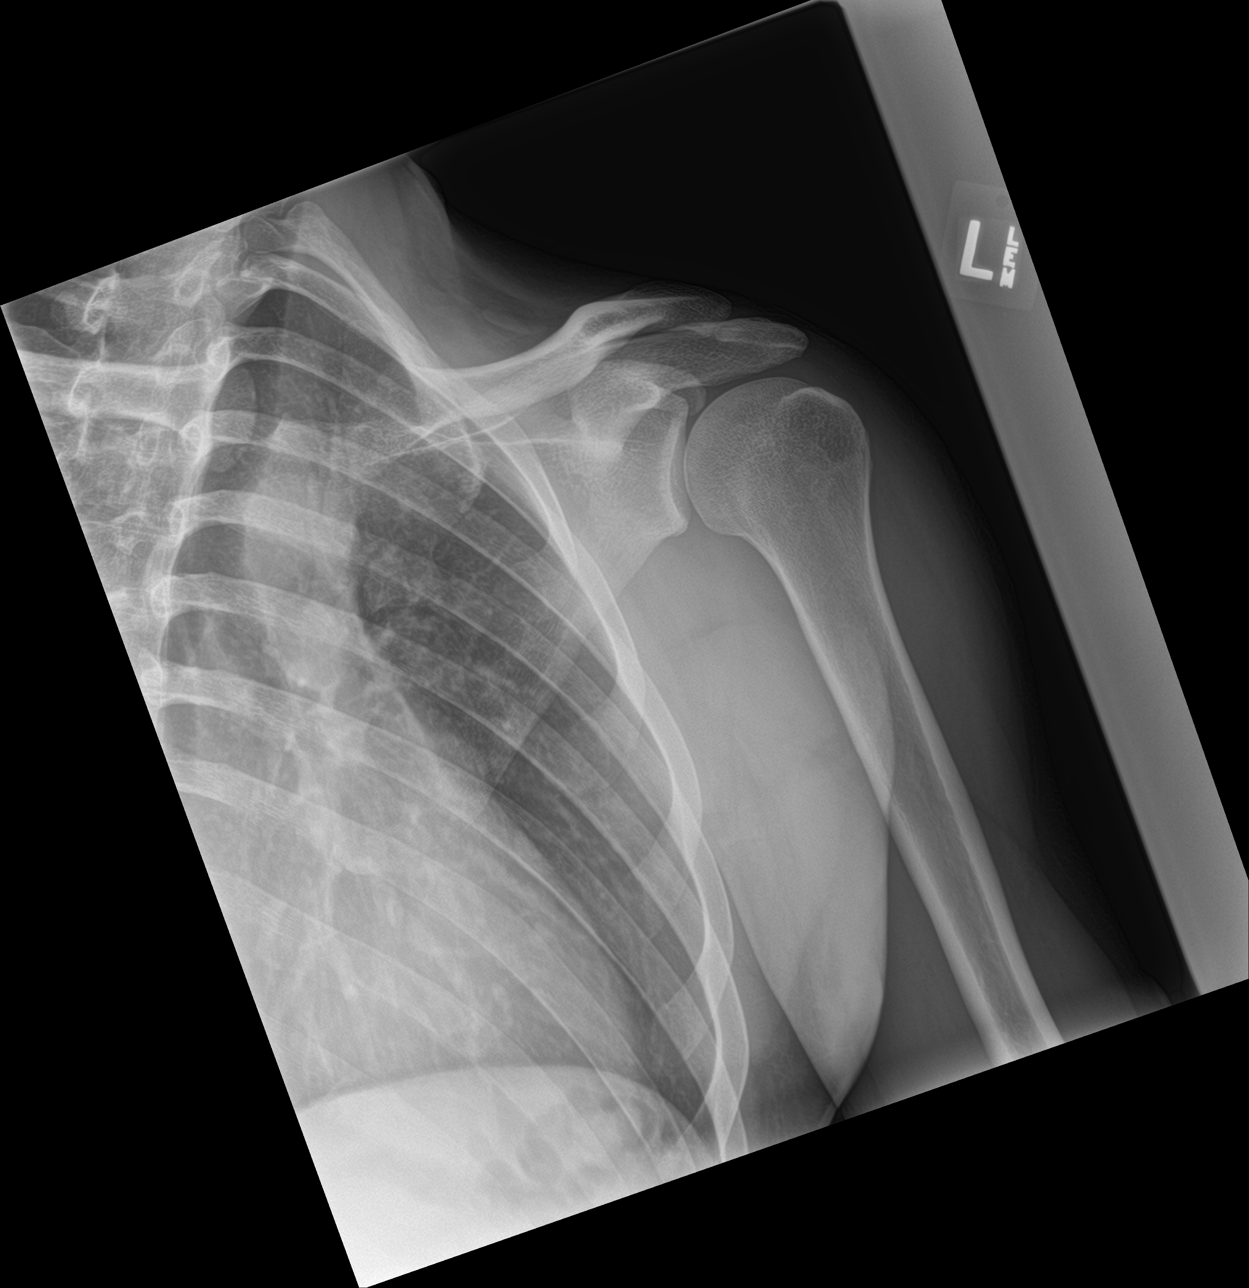

[shoulder y view]
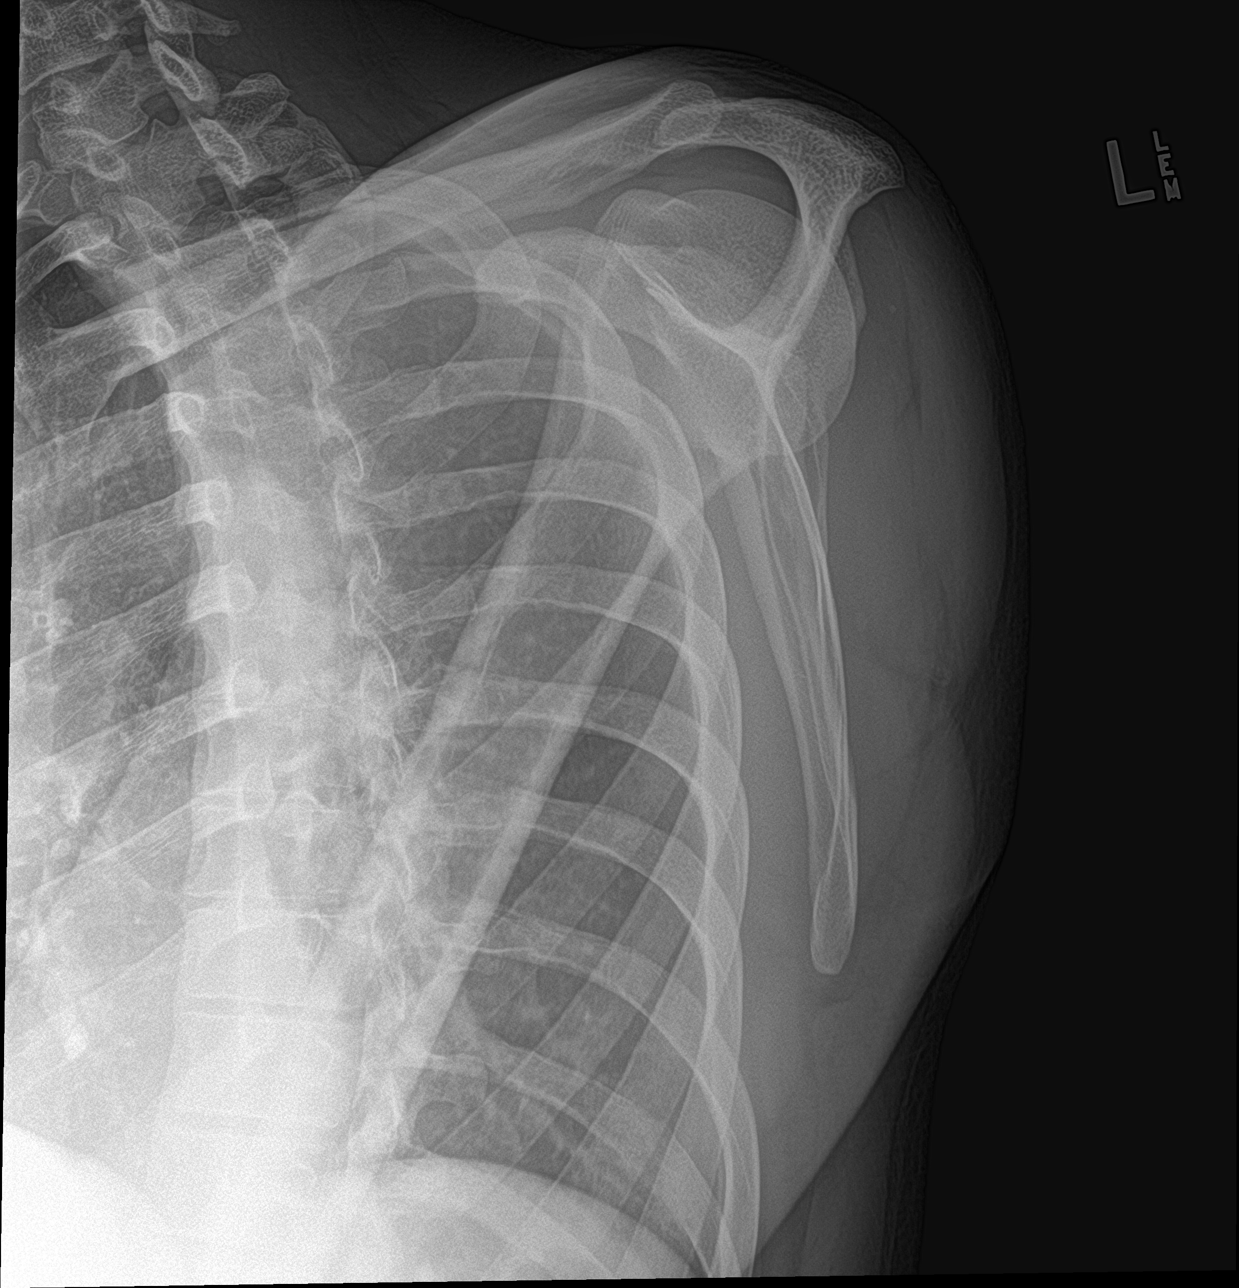

[shoulder axillary]
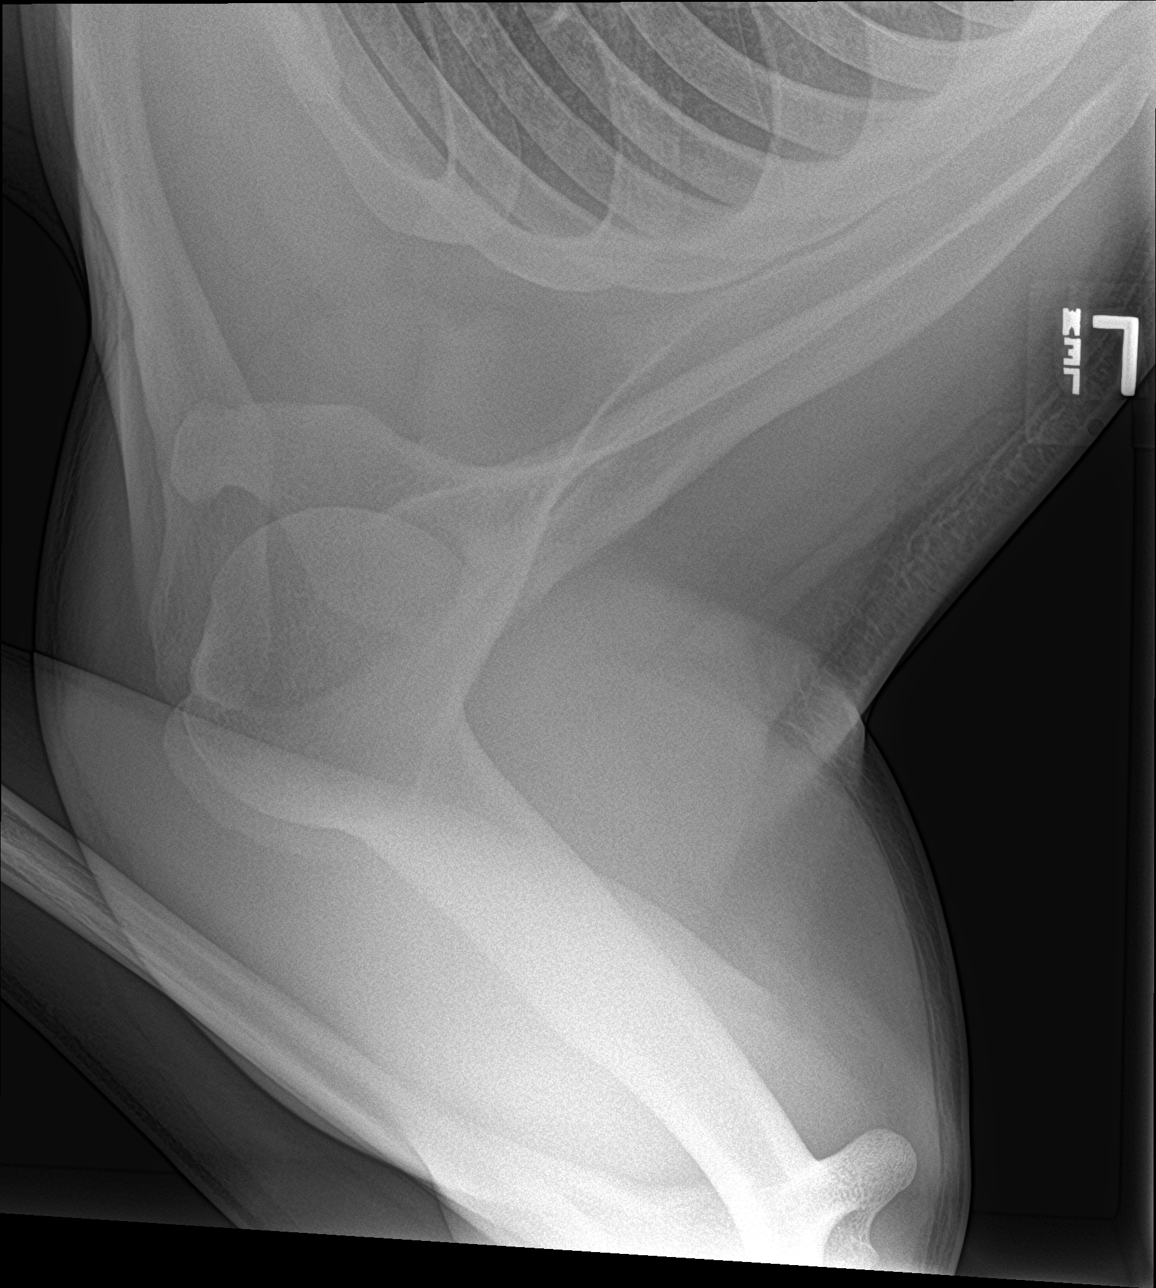

[3 of 3 positions shown; findings below may reference images not displayed]

FINDINGS: There is no evidence of fracture or dislocation. There is no
evidence of arthropathy or other focal bone abnormality. Soft
tissues are unremarkable.
IMPRESSION: Negative.

## 2020-09-12 ENCOUNTER — Ambulatory Visit
Admission: EM | Admit: 2020-09-12 | Discharge: 2020-09-12 | Disposition: A | Discharge status: Home / Self Care | Payer: Self-pay | Attending: Emergency Medicine | Admitting: Emergency Medicine

## 2020-09-12 ENCOUNTER — Other Ambulatory Visit: Payer: Self-pay

## 2020-09-12 DIAGNOSIS — S8391XA Sprain of unspecified site of right knee, initial encounter: Secondary | ICD-10-CM

## 2020-09-12 MED ORDER — IBUPROFEN 800 MG PO TABS: 800.0000 mg | ORAL_TABLET | Freq: Three times a day (TID) | ORAL | 0 refills | Status: DC

## 2020-09-12 NOTE — ED Provider Notes (Signed)
[facility]    CSN: [id number] Arrival date & time: 09/12/20  8270      History   Chief Complaint Chief Complaint  Patient presents with  . Knee Pain    right    HPI Dave Rivera. is a 30 y.o. male.   Dave Rivera. presents with complaints of right knee pain which started three days ago. He works a very physical job and had been working in difficult environment that day, although no specific injury. While playing with his child that evening he feels he may have twisted/ injured it, as noted increased pain after this. This morning woke with swelling. No redness or warmth. No previous knee injury. States he was told as a child he may have rheumatoid arthritis. He doesn't have any regular issues or management with this, however. He has been wearing a brace which helps some. Pain is worse with kneeling/ squatting/ flexion. No numbness or tingling. No other previous knee injuries. He has been medicating by vaping cannabis but otherwise no medications for pain.    ROS per HPI, negative if not otherwise mentioned.      Past Medical History:  Diagnosis Date  . ADD (attention deficit disorder)   . ODD (oppositional defiant disorder)     There are no problems to display for this patient.   Past Surgical History:  Procedure Laterality Date  . WRIST ARTHROPLASTY         Home Medications    Prior to Admission medications   Medication Sig Start Date End Date Taking? Authorizing Provider  ibuprofen (ADVIL) 800 MG tablet Take 1 tablet (800 mg total) by mouth 3 (three) times daily. 09/12/20  Yes Georgetta Haber, NP    Family History Family History  Problem Relation Age of Onset  . Lupus Father     Social History Social History   Tobacco Use  . Smoking status: Current Every Day Smoker    Packs/day: 1.00    Types: Cigarettes  . Smokeless tobacco: Never Used  Vaping Use  . Vaping Use: Never used  Substance Use Topics  . Alcohol use: No  . Drug  use: Yes    Types: Marijuana    Comment: last use today     Allergies   Patient has no known allergies.   Review of Systems Review of Systems   Physical Exam Triage Vital Signs ED Triage Vitals  Enc Vitals Group     BP 09/12/20 0853 127/83     Pulse Rate 09/12/20 0853 71     Resp 09/12/20 0853 18     Temp 09/12/20 0853 98.4 F (36.9 C)     Temp Source 09/12/20 0853 Oral     SpO2 09/12/20 0853 100 %     Weight 09/12/20 0852 200 lb (90.7 kg)     Height 09/12/20 0852 6\' 1"  (1.854 m)     Head Circumference --      Peak Flow --      Pain Score --      Pain Loc --      Pain Edu? --      Excl. in GC? --    No data found.  Updated Vital Signs BP 127/83 (BP Location: Left Arm)   Pulse 71   Temp 98.4 F (36.9 C) (Oral)   Resp 18   Ht 6\' 1"  (1.854 m)   Wt 200 lb (90.7 kg)   SpO2 100%   BMI 26.39 kg/m  Visual Acuity Right Eye Distance:   Left Eye Distance:   Bilateral Distance:    Right Eye Near:   Left Eye Near:    Bilateral Near:     Physical Exam Constitutional:      Appearance: He is well-developed.  Cardiovascular:     Rate and Rhythm: Normal rate.  Pulmonary:     Effort: Pulmonary effort is normal.  Musculoskeletal:     Right knee: No deformity, erythema, ecchymosis or bony tenderness. Normal range of motion. No tenderness.     Comments: Minimal to no obvious swelling visibly; no redness or warmth; ambulatory without difficulty; subjective pain with flexion at approximately 140deg  Skin:    General: Skin is warm and dry.  Neurological:     Mental Status: He is alert and oriented to person, place, and time.      UC Treatments / Results  Labs (all labs ordered are listed, but only abnormal results are displayed) Labs Reviewed - No data to display  EKG   Radiology No results found.  Procedures Procedures (including critical care time)  Medications Ordered in UC Medications - No data to display  Initial Impression / Assessment and  Plan / UC Course  I have reviewed the triage vital signs and the nursing notes.  Pertinent labs & imaging results that were available during my care of the patient were reviewed by me and considered in my medical decision making (see chart for details).     No red flag findings and without traumatic injury, imaging deferred. Consistent with sprain, arthritis considered as well given amount of activity he does for work. RICE, nsaids. Ortho follow up prn. Patient verbalized understanding and agreeable to plan.   Final Clinical Impressions(s) / UC Diagnoses   Final diagnoses:  Sprain of right knee, unspecified ligament, initial encounter     Discharge Instructions     Ice, elevate, use of ace wrap and/or brace as needed.  Ibuprofen every 8 hours as needed to help with pain and swelling.  Activity as tolerated.  Follow up with orthopedics if no improvement in the next 4 weeks.    ED Prescriptions    Medication Sig Dispense Auth. Provider   ibuprofen (ADVIL) 800 MG tablet Take 1 tablet (800 mg total) by mouth 3 (three) times daily. 30 tablet Georgetta Haber, NP     PDMP not reviewed this encounter.   Georgetta Haber, NP 09/12/20 [phone]

## 2020-09-12 NOTE — Discharge Instructions (Signed)
Ice, elevate, use of ace wrap and/or brace as needed.  Ibuprofen every 8 hours as needed to help with pain and swelling.  Activity as tolerated.  Follow up with orthopedics if no improvement in the next 4 weeks.

## 2020-09-12 NOTE — ED Triage Notes (Signed)
Pt c/o right knee pain since Tuesday. 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 Pt states his knee does seem swollen. Pt denies any known injury to the knee.

## 2020-09-17 ENCOUNTER — Other Ambulatory Visit: Payer: Self-pay

## 2020-09-17 ENCOUNTER — Ambulatory Visit
Admission: EM | Admit: 2020-09-17 | Discharge: 2020-09-17 | Disposition: A | Discharge status: Home / Self Care | Payer: Self-pay

## 2020-09-17 DIAGNOSIS — M25561 Pain in right knee: Secondary | ICD-10-CM

## 2020-09-17 NOTE — ED Provider Notes (Signed)
[facility]    CSN: [id number] Arrival date & time: 09/17/20  0800      History   Chief Complaint Chief Complaint  Patient presents with  . Knee Pain  . Hip Pain    HPI Rolf Fells. is a 30 y.o. male.   HPI   30 year old male here for evaluation of right knee pain.  Patient reports that he was evaluated here 6 days ago for right knee pain and that it is continuing to get worse.  Patient reports he now has pain radiating to his right hip and buttock.  He also reports intermittent burning sensation down the front of his shin into his third and fourth toe.  No other numbness or tingling or weakness of the right extremity.  No known injury.  Past Medical History:  Diagnosis Date  . ADD (attention deficit disorder)   . ODD (oppositional defiant disorder)     There are no problems to display for this patient.   Past Surgical History:  Procedure Laterality Date  . WRIST ARTHROPLASTY         Home Medications    Prior to Admission medications   Medication Sig Start Date End Date Taking? Authorizing Provider  ibuprofen (ADVIL) 800 MG tablet Take 1 tablet (800 mg total) by mouth 3 (three) times daily. 09/12/20   Georgetta Haber, NP    Family History Family History  Problem Relation Age of Onset  . Lupus Father     Social History Social History   Tobacco Use  . Smoking status: Current Every Day Smoker    Packs/day: 1.00    Types: Cigarettes  . Smokeless tobacco: Never Used  Vaping Use  . Vaping Use: Never used  Substance Use Topics  . Alcohol use: No  . Drug use: Yes    Types: Marijuana    Comment: last use last night     Allergies   Patient has no known allergies.   Review of Systems Review of Systems  Constitutional: Negative for activity change and appetite change.  Musculoskeletal: Positive for myalgias. Negative for joint swelling.  Skin: Negative for color change.  Neurological: Negative for weakness and numbness.   Hematological: Negative.   Psychiatric/Behavioral: Negative.      Physical Exam Triage Vital Signs ED Triage Vitals [09/17/20 0814]  Enc Vitals Group     BP (!) 132/94     Pulse Rate 87     Resp 16     Temp 98.4 F (36.9 C)     Temp Source Oral     SpO2 99 %     Weight 199 lb 15.3 oz (90.7 kg)     Height 6\' 1"  (1.854 m)     Head Circumference      Peak Flow      Pain Score 5     Pain Loc      Pain Edu?      Excl. in GC?    No data found.  Updated Vital Signs BP (!) 132/94 (BP Location: Left Arm)   Pulse 87   Temp 98.4 F (36.9 C) (Oral)   Resp 16   Ht 6\' 1"  (1.854 m)   Wt 199 lb 15.3 oz (90.7 kg)   SpO2 99%   BMI 26.38 kg/m   Visual Acuity Right Eye Distance:   Left Eye Distance:   Bilateral Distance:    Right Eye Near:   Left Eye Near:    Bilateral Near:  Physical Exam Vitals and nursing note reviewed.  Constitutional:      General: He is not in acute distress.    Appearance: Normal appearance. He is normal weight. He is not ill-appearing.  HENT:     Head: Normocephalic and atraumatic.  Musculoskeletal:        General: Tenderness present. No swelling, deformity or signs of injury. Normal range of motion.  Skin:    General: Skin is warm and dry.     Capillary Refill: Capillary refill takes less than 2 seconds.     Findings: No bruising or erythema.  Neurological:     General: No focal deficit present.     Mental Status: He is alert and oriented to person, place, and time.     Sensory: No sensory deficit.     Motor: No weakness.  Psychiatric:        Mood and Affect: Mood normal.        Behavior: Behavior normal.        Thought Content: Thought content normal.        Judgment: Judgment normal.      UC Treatments / Results  Labs (all labs ordered are listed, but only abnormal results are displayed) Labs Reviewed - No data to display  EKG   Radiology No results found.  Procedures Procedures (including critical care  time)  Medications Ordered in UC Medications - No data to display  Initial Impression / Assessment and Plan / UC Course  I have reviewed the triage vital signs and the nursing notes.  Pertinent labs & imaging results that were available during my care of the patient were reviewed by me and considered in my medical decision making (see chart for details).   Patient is a very pleasant, nontoxic-appearing 30 year old male here for reevaluation of right knee pain that has been ongoing for over a week.  He was evaluated here 6 days ago and discharged with right knee sprain.  He is currently wearing a knee brace and he was prescribed 800 mg ibuprofen which he has not picked up from the pharmacy yet.  Patient reports that after he was evaluated he went to the beach and sat around for 3 days and he is continued to have pain and swelling of his knee and he reports that the swelling has gone up his right thigh and he is having pain in his right buttock.  The pain in his buttock is not present when he is standing but if he rotates from side to side he will feel the pain.  Reports that his knee pain feels like it is more in the middle of the knee.  On physical exam patient's right knee is in normal physical alignment and there is no appreciable swelling when comparing right and left thighs and right and left knees.  Patient has no tenderness when palpating the patella, tibial tuberosity, patellar tendon, medial or lateral joint line, or pain with varus and valgus stress application.  Patient does have mild tenderness with palpation of the central aspect of the quadriceps complex and with palpation of the IT band.  There is no popping or clicking with passive range of motion.  Patient has a negative anterior and posterior drawer test.  Patient does have a positive McMurray's test on the medial aspect of the knee.  PT and DP pulses are 2+.  With patient having pain with transitioning from sitting to standing or with  walking up and on stairs I suspect patient  has a meniscus injury.  We will have patient fill the ibuprofen prescription, continue to wear his knee brace, and give home PT exercises.  Patient advised that he needs to follow-up with orthopedics if his pain does not improve.  We will give patient a work note to cover today and tomorrow.   Final Clinical Impressions(s) / UC Diagnoses   Final diagnoses:  Acute pain of right knee     Discharge Instructions     Wear the knee brace to help protect your right knee from further injury.  You may take it off to bathe or when you go to bed at night.  Fill the prescription for ibuprofen as previously prescribed and start taking it 3 times a day as needed for your pain.  I would take it on a schedule for the next 2 days and then back off to as needed.  Make sure you take this with food.  Follow the rehabilitation exercises given to your discharge paperwork.  If your pain does not improve you need to follow-up with orthopedics.    ED Prescriptions    None     PDMP not reviewed this encounter.   Becky [place], NP 09/17/20 [phone]

## 2020-09-17 NOTE — Discharge Instructions (Addendum)
Wear the knee brace to help protect your right knee from further injury.  You may take it off to bathe or when you go to bed at night.  Fill the prescription for ibuprofen as previously prescribed and start taking it 3 times a day as needed for your pain.  I would take it on a schedule for the next 2 days and then back off to as needed.  Make sure you take this with food.  Follow the rehabilitation exercises given to your discharge paperwork.  If your pain does not improve you need to follow-up with orthopedics.

## 2020-09-17 NOTE — ED Triage Notes (Signed)
Pt states he was seen here on Thursday for same. Right knee pain not improving. Now hurting into his right hip

## 2020-09-22 ENCOUNTER — Other Ambulatory Visit: Payer: Self-pay

## 2020-09-22 ENCOUNTER — Encounter: Payer: Self-pay | Admitting: Emergency Medicine

## 2020-09-22 ENCOUNTER — Ambulatory Visit
Admission: EM | Admit: 2020-09-22 | Discharge: 2020-09-22 | Disposition: A | Discharge status: Home / Self Care | Payer: Self-pay | Attending: Family Medicine | Admitting: Family Medicine

## 2020-09-22 DIAGNOSIS — M25561 Pain in right knee: Secondary | ICD-10-CM

## 2020-09-22 MED ORDER — KETOROLAC TROMETHAMINE 10 MG PO TABS: 10.0000 mg | ORAL_TABLET | Freq: Four times a day (QID) | ORAL | 0 refills | Status: DC | PRN

## 2020-09-22 NOTE — ED Triage Notes (Signed)
Pt presents today with continued pain to right knee that radiates up to right hip. Pain 7/10.  He reports taking two Oxycodone tables pta "that he had left over from tooth surgery".

## 2020-09-22 NOTE — ED Provider Notes (Signed)
[facility]    CSN: [id number] Arrival date & time: 09/22/20  1356      History   Chief Complaint Chief Complaint  Patient presents with   Knee Pain    right   HPI  30 year old male presents with the above complaint.  Patient has been seen here twice recently.  I have reviewed the prior notes.  He states that his Workmen's Comp. claim is being evaluated.  He got a phone call during his visit today in which he was informed that the Workmen's Comp. claim will be denied.  There was no discrete injury at work per recent visits and per the patient.  He states that he was carrying a heavy amount of weight.  He thinks that this may have contributed to his pain.  He also reports a possible twisting injury while at home with his daughter.  Patient reports that he has not improved.  He continues to have right knee pain.  Also reports pain of the thigh and buttock.  He states that he has difficulty with activity and ambulation.  His pain is 7/10 in severity.  He has used some leftover oxycodone which has helped his pain significantly.  He has not seen orthopedics.  He has not had any other follow-up care other than his recent visits here.  Past Medical History:  Diagnosis Date   ADD (attention deficit disorder)    ODD (oppositional defiant disorder)    Past Surgical History:  Procedure Laterality Date   WRIST ARTHROPLASTY     Home Medications    Prior to Admission medications   Medication Sig Start Date End Date Taking? Authorizing Provider  ketorolac (TORADOL) 10 MG tablet Take 1 tablet (10 mg total) by mouth every 6 (six) hours as needed for moderate pain or severe pain. 09/22/20  Yes Tommie Sams, DO    Family History Family History  Problem Relation Age of Onset   Lupus Father     Social History Social History   Tobacco Use   Smoking status: Every Day    Packs/day: 1.00    Pack years: 0.00    Types: Cigarettes   Smokeless tobacco: Never  Vaping Use   Vaping  Use: Never used  Substance Use Topics   Alcohol use: No   Drug use: Yes    Types: Marijuana    Comment: last use last night     Allergies   Patient has no known allergies.   Review of Systems Review of Systems Per HPI  Physical Exam Triage Vital Signs ED Triage Vitals  Enc Vitals Group     BP 09/22/20 1523 131/82     Pulse Rate 09/22/20 1523 73     Resp 09/22/20 1523 18     Temp 09/22/20 1523 98.2 F (36.8 C)     Temp Source 09/22/20 1523 Oral     SpO2 09/22/20 1523 99 %     Weight --      Height --      Head Circumference --      Peak Flow --      Pain Score 09/22/20 1520 7     Pain Loc --      Pain Edu? --      Excl. in [place]? --    Updated Vital Signs BP 131/82 (BP Location: Left Arm)   Pulse 73   Temp 98.2 F (36.8 C) (Oral)   Resp 18   SpO2 99%   Visual Acuity  Right Eye Distance:   Left Eye Distance:   Bilateral Distance:    Right Eye Near:   Left Eye Near:    Bilateral Near:     Physical Exam Vitals and nursing note reviewed.  Constitutional:      General: He is not in acute distress.    Appearance: Normal appearance. He is not ill-appearing.  HENT:     Head: Normocephalic and atraumatic.  Pulmonary:     Effort: Pulmonary effort is normal. No respiratory distress.  Musculoskeletal:     Comments: Right knee -no appreciable effusion.  No discrete areas of tenderness to palpation.  Neurological:     Mental Status: He is alert.    UC Treatments / Results  Labs (all labs ordered are listed, but only abnormal results are displayed) Labs Reviewed - No data to display  EKG   Radiology No results found.  Procedures Procedures (including critical care time)  Medications Ordered in UC Medications - No data to display  Initial Impression / Assessment and Plan / UC Course  I have reviewed the triage vital signs and the nursing notes.  Pertinent labs & imaging results that were available during my care of the patient were reviewed by me  and considered in my medical decision making (see chart for details).    30 year old male presents with persistent right knee pain.  He is also complaining of pain of his thigh and buttock.  This is his third evaluation here.  I have reviewed the prior notes.  His Workmen's Comp. claim got denied.  His exam is unrevealing.  There is concern for possible ligamentous injury or meniscal injury.  Toradol as needed.  I have advised him to see orthopedics.  Information given.  Supportive care.  Final Clinical Impressions(s) / UC Diagnoses   Final diagnoses:  Acute pain of right knee     Discharge Instructions      Use the medication as prescribed.  No ibuprofen or other NSAID's with this medicine.  Please call [facility] clinic Orthopedics [phone]) OR [facility] [phone]) for an appt.  Take care  Dr. Adriana Simas    ED Prescriptions     Medication Sig Dispense Auth. Provider   ketorolac (TORADOL) 10 MG tablet Take 1 tablet (10 mg total) by mouth every 6 (six) hours as needed for moderate pain or severe pain. 20 tablet Tommie Sams, DO      PDMP not reviewed this encounter.   Tommie Sams, [place] 09/22/20 [phone]

## 2020-09-22 NOTE — Discharge Instructions (Signed)
Use the medication as prescribed.  No ibuprofen or other NSAID's with this medicine.  Please call [facility] clinic Orthopedics [phone]) OR [facility] [phone]) for an appt.  Take care  Dr. Adriana Simas

## 2021-07-24 ENCOUNTER — Emergency Department: Payer: Self-pay

## 2021-07-24 ENCOUNTER — Other Ambulatory Visit: Payer: Self-pay

## 2021-07-24 ENCOUNTER — Emergency Department
Admission: EM | Admit: 2021-07-24 | Discharge: 2021-07-24 | Disposition: A | Discharge status: Home / Self Care | Payer: Self-pay | Attending: Emergency Medicine | Admitting: Emergency Medicine

## 2021-07-24 DIAGNOSIS — J40 Bronchitis, not specified as acute or chronic: Secondary | ICD-10-CM | POA: Insufficient documentation

## 2021-07-24 DIAGNOSIS — R079 Chest pain, unspecified: Secondary | ICD-10-CM

## 2021-07-24 LAB — CBC
HCT: 46.8 % (ref 39.0–52.0)
Hemoglobin: 15.8 g/dL (ref 13.0–17.0)
MCH: 29 pg (ref 26.0–34.0)
MCHC: 33.8 g/dL (ref 30.0–36.0)
MCV: 86 fL (ref 80.0–100.0)
Platelets: 310 10*3/uL (ref 150–400)
RBC: 5.44 MIL/uL (ref 4.22–5.81)
RDW: 12.7 % (ref 11.5–15.5)
WBC: 8 10*3/uL (ref 4.0–10.5)
nRBC: 0 % (ref 0.0–0.2)

## 2021-07-24 LAB — BASIC METABOLIC PANEL
Anion gap: 9 (ref 5–15)
BUN: 15 mg/dL (ref 6–20)
CO2: 26 mmol/L (ref 22–32)
Calcium: 8.9 mg/dL (ref 8.9–10.3)
Chloride: 103 mmol/L (ref 98–111)
Creatinine, Ser: 0.92 mg/dL (ref 0.61–1.24)
GFR, Estimated: >60 mL/min (ref >60)
Glucose, Bld: 113 mg/dL — ABNORMAL HIGH (ref 70–99)
Potassium: 3.9 mmol/L (ref 3.5–5.1)
Sodium: 138 mmol/L (ref 135–145)

## 2021-07-24 LAB — TROPONIN I (HIGH SENSITIVITY): Troponin I (High Sensitivity): <2 ng/L (ref <18)

## 2021-07-24 MED ORDER — IOHEXOL 350 MG/ML SOLN
75.0000 mL | Freq: Once | INTRAVENOUS | Status: AC | PRN
Start: 2021-07-24 — End: 2021-07-24
  Administered 2021-07-24: 75 mL via INTRAVENOUS
  Filled 2021-07-24: qty 75

## 2021-07-24 MED ORDER — ALBUTEROL SULFATE HFA 108 (90 BASE) MCG/ACT IN AERS: 2.0000 | INHALATION_SPRAY | Freq: Four times a day (QID) | RESPIRATORY_TRACT | 0 refills | Status: DC | PRN

## 2021-07-24 MED ORDER — LIDOCAINE 5 % EX PTCH: 1.0000 | MEDICATED_PATCH | Freq: Two times a day (BID) | CUTANEOUS | 0 refills | Status: AC

## 2021-07-24 MED ORDER — KETOROLAC TROMETHAMINE 30 MG/ML IJ SOLN
15.0000 mg | Freq: Once | INTRAMUSCULAR | Status: AC
  Administered 2021-07-24: 15 mg via INTRAVENOUS
  Filled 2021-07-24: qty 1

## 2021-07-24 NOTE — ED Provider Notes (Signed)
? ?[facility] ?Provider Note ? ? ? Event Date/Time  ? First MD Initiated Contact with Patient 07/24/21 1415   ?  (approximate) ? ? ?History  ? ?Chief Complaint ?Chest Pain ? ? ?HPI ? ?Dave Rivera. is a 31 y.o. male with no significant past medical history who presents to the ED complaining of chest pain.  Patient reports that he has been dealing with intermittent pain in the left side of his chest for about the past month.  He describes the pain as a dull ache that becomes severe at times, affecting the left side of his chest as well as his left upper back below his shoulder blade.  He has felt short of breath at times but denies any current difficulty breathing, has not had any fevers or cough.  He states that the symptoms seem to worsen at random times, are not associated with deep breath, exertion, or heavy lifting.  He denies any recent trauma to his chest and has not noticed any pain or swelling in his legs.  He does report a strong family history of coronary artery disease. ?  ? ? ?Physical Exam  ? ?Triage Vital Signs: ?ED Triage Vitals  ?Enc Vitals Group  ?   BP 07/24/21 1414 (!) 131/91  ?   Pulse Rate 07/24/21 1414 (!) 116  ?   Resp 07/24/21 1414 20  ?   Temp 07/24/21 1414 98.4 ?F (36.9 ?C)  ?   Temp Source 07/24/21 1414 Oral  ?   SpO2 07/24/21 1414 95 %  ?   Weight 07/24/21 1418 200 lb (90.7 kg)  ?   Height 07/24/21 1418 6\' 1"  (1.854 m)  ?   Head Circumference --   ?   Peak Flow --   ?   Pain Score 07/24/21 1418 3  ?   Pain Loc --   ?   Pain Edu? --   ?   Excl. in [place]? --   ? ? ?Most recent vital signs: ?Vitals:  ? 07/24/21 1414 07/24/21 1625  ?BP: (!) 131/91 128/88  ?Pulse: (!) 116 100  ?Resp: 20 20  ?Temp: 98.4 ?F (36.9 ?C)   ?SpO2: 95% 96%  ? ? ?Constitutional: Alert and oriented. ?Eyes: Conjunctivae are normal. ?Head: Atraumatic. ?Nose: No congestion/rhinnorhea. ?Mouth/Throat: Mucous membranes are moist.  ?Cardiovascular: Normal rate, regular rhythm. Grossly normal heart  sounds.  2+ radial pulses bilaterally. ?Respiratory: Normal respiratory effort.  No retractions. Lungs CTAB.  No chest wall tenderness to palpation. ?Gastrointestinal: Soft and nontender. No distention. ?Musculoskeletal: No lower extremity tenderness nor edema.  ?Neurologic:  Normal speech and language. No gross focal neurologic deficits are appreciated. ? ? ? ?ED Results / Procedures / Treatments  ? ?Labs ?(all labs ordered are listed, but only abnormal results are displayed) ?Labs Reviewed  ?BASIC METABOLIC PANEL - Abnormal; Notable for the following components:  ?    Result Value  ? Glucose, Bld 113 (*)   ? All other components within normal limits  ?CBC  ?TROPONIN I (HIGH SENSITIVITY)  ? ? ? ?EKG ? ?ED ECG REPORT ?Tempie Hoist, the attending physician, personally viewed and interpreted this ECG. ? ? Date: 07/24/2021 ? EKG Time: 14:16 ? Rate: 119 ? Rhythm: sinus tachycardia ? Axis: Normal ? Intervals:none ? ST&T Change: Inferolateral Q waves with nonspecific T wave changes ? ?RADIOLOGY ?CTA chest reviewed by me with no large pulmonary embolus, focal pneumonia, or effusion. ? ?PROCEDURES: ? ?Critical Care performed: No ? ?  Procedures ? ? ?MEDICATIONS ORDERED IN ED: ?Medications  ?ketorolac (TORADOL) 30 MG/ML injection 15 mg (15 mg Intravenous Given 07/24/21 1502)  ?iohexol (OMNIPAQUE) 350 MG/ML injection 75 mL (75 mLs Intravenous Contrast Given 07/24/21 1515)  ? ? ? ?IMPRESSION / MDM / ASSESSMENT AND PLAN / ED COURSE  ?I reviewed the triage vital signs and the nursing notes. ?             ?               ? ?31 y.o. male with no significant past medical history presents to the ED complaining of throbbing pain in his left chest associated with discomfort in his left upper back present intermittently for the past month and severe at times. ? ?Differential diagnosis includes, but is not limited to, ACS, PE, pneumonia, pneumothorax, musculoskeletal pain, GERD, anxiety. ? ?Patient nontoxic-appearing and in no acute  distress, vitals remarkable for tachycardia but otherwise reassuring.  Patient currently denies any difficulty breathing and is maintaining O2 sats at 95% on room air.  EKG shows sinus tachycardia with inferolateral Q waves and nonspecific T wave changes.  Symptoms are atypical for ACS and troponin is pending at this time.  We will also assess for PE with CTA of his chest, CBC and BMP are pending.  We will treat symptomatically with IV Toradol and reassess. ? ?Labs are reassuring, CBC shows no anemia or leukocytosis, troponin within normal limits, and BMP without AKI or electrolyte abnormality.  CTA is negative for PE, does show heterogeneity in patient's lungs potentially related to smoking versus bronchitis or asthma.  Patient may have some inflammatory changes related to bronchitis contributing to his pain, he was also counseled to quit smoking as this may be a contributing factor to his symptoms.  Given reassuring work-up, he is appropriate for discharge home with outpatient follow-up, was counseled to establish care with PCP.  He will be prescribed inhaler for intermittent difficulty breathing and was counseled to return to the ED for new or worsening symptoms, patient agrees with plan. ? ?  ? ? ?FINAL CLINICAL IMPRESSION(S) / ED DIAGNOSES  ? ?Final diagnoses:  ?Chest pain, unspecified type  ?Bronchitis  ? ? ? ?Rx / DC Orders  ? ?ED Discharge Orders   ? ?      Ordered  ?  albuterol (VENTOLIN HFA) 108 (90 Base) MCG/ACT inhaler  Every 6 hours PRN       ?Note to Pharmacy: Please supply with spacer  ? 07/24/21 1629  ?  lidocaine (LIDODERM) 5 %  Every 12 hours       ? 07/24/21 1629  ? ?  ?  ? ?  ? ? ? ?Note:  This document was prepared using Dragon voice recognition software and may include unintentional dictation errors. ?  ?Blake Divine, MD ?07/24/21 1631 ? ?

## 2021-07-24 NOTE — ED Triage Notes (Signed)
Pt to ED for left sided chest pain intermittent for a month. States thought had a pulled muscle. States has progressed to shob. Denies n/v ?+dizziness  ?

## 2022-08-14 ENCOUNTER — Encounter: Payer: Self-pay | Admitting: Emergency Medicine

## 2022-08-14 ENCOUNTER — Ambulatory Visit
Admission: EM | Admit: 2022-08-14 | Discharge: 2022-08-14 | Disposition: A | Discharge status: Home / Self Care | Payer: Self-pay | Attending: Emergency Medicine | Admitting: Emergency Medicine

## 2022-08-14 DIAGNOSIS — W57XXXA Bitten or stung by nonvenomous insect and other nonvenomous arthropods, initial encounter: Secondary | ICD-10-CM

## 2022-08-14 DIAGNOSIS — K047 Periapical abscess without sinus: Secondary | ICD-10-CM

## 2022-08-14 MED ORDER — DOXYCYCLINE HYCLATE 100 MG PO CAPS: 100.0000 mg | ORAL_CAPSULE | Freq: Two times a day (BID) | ORAL | 0 refills | Status: DC

## 2022-08-14 NOTE — Discharge Instructions (Signed)
Take the Doxycycline twice daily with food for your dental infection and also for your Tick bite.  Rinse after meals to was away drainage and remove any food particles from your infected tooth that could worsen your symptoms.  Use OTC Tylenol and Ibuprofen as needed for pain.  If you develop any facial swelling, fever, or increased pain I recommend that you go to the ER at [facility] to be evaluated by the dentist on call.

## 2022-08-14 NOTE — ED Triage Notes (Signed)
Patient presents with tooth pain on right side x 3-4 days, treated with magic mouth wash and ibuprofen. Pt states he need an antibiotics. Patient also reports tick and would like antibiotics.

## 2022-08-14 NOTE — ED Provider Notes (Signed)
[facility]    CSN: [id number] Arrival date & time: 08/14/22  1102      History   Chief Complaint No chief complaint on file.   HPI Dave Rivera. is a 32 y.o. male.   HPI  32 year old male with a history of ADD and ODD presents for evaluation of right-sided tooth pain that is been present for the last 3 to 4 days.  He has been using Magic mouthwash and ibuprofen but states he feels he needs an antibiotic.  Patient also reports a tick bite and he would like antibiotics for that as well.  Past Medical History:  Diagnosis Date   ADD (attention deficit disorder)    ODD (oppositional defiant disorder)     There are no problems to display for this patient.   Past Surgical History:  Procedure Laterality Date   WRIST ARTHROPLASTY         Home Medications    Prior to Admission medications   Medication Sig Start Date End Date Taking? Authorizing Provider  doxycycline (VIBRAMYCIN) 100 MG capsule Take 1 capsule (100 mg total) by mouth 2 (two) times daily. 08/14/22  Yes Becky Augusta, NP  albuterol (VENTOLIN HFA) 108 (90 Base) MCG/ACT inhaler Inhale 2 puffs into the lungs every 6 (six) hours as needed for wheezing or shortness of breath. 07/24/21   Chesley Noon, MD  ketorolac (TORADOL) 10 MG tablet Take 1 tablet (10 mg total) by mouth every 6 (six) hours as needed for moderate pain or severe pain. 09/22/20   Tommie Sams, DO    Family History Family History  Problem Relation Age of Onset   Lupus Father     Social History Social History   Tobacco Use   Smoking status: Every Day    Packs/day: 1    Types: Cigarettes   Smokeless tobacco: Never  Vaping Use   Vaping Use: Never used  Substance Use Topics   Alcohol use: No   Drug use: Yes    Types: Marijuana    Comment: last use last night     Allergies   Patient has no known allergies.   Review of Systems Review of Systems  Constitutional:  Negative for fever.  HENT:  Positive for dental  problem.   Skin:  Positive for color change and wound.     Physical Exam Triage Vital Signs ED Triage Vitals  Enc Vitals Group     BP      Pulse      Resp      Temp      Temp src      SpO2      Weight      Height      Head Circumference      Peak Flow      Pain Score      Pain Loc      Pain Edu?      Excl. in GC?    No data found.  Updated Vital Signs BP 116/80 (BP Location: Left Arm)   Pulse 86   Temp 98.5 F (36.9 C) (Oral)   Resp 18   Ht 6\' 1"  (1.854 m)   Wt 200 lb (90.7 kg)   SpO2 95%   BMI 26.39 kg/m   Visual Acuity Right Eye Distance:   Left Eye Distance:   Bilateral Distance:    Right Eye Near:   Left Eye Near:    Bilateral Near:     Physical Exam  Vitals and nursing note reviewed.  Constitutional:      Appearance: Normal appearance. He is not ill-appearing.  HENT:     Head: Normocephalic and atraumatic.     Mouth/Throat:     Mouth: Mucous membranes are moist.     Pharynx: Oropharynx is clear. No oropharyngeal exudate or posterior oropharyngeal erythema.     Comments: Patient has a decayed right lower first molar.  The molar is tender to compression but there is no erythema to the gum tissue or pus noted around the tooth. Skin:    General: Skin is warm and dry.     Findings: Lesion present.     Comments: Patient has a single mildly erythematous maculopapular lesion in his right axilla that he reports is secondary to a tick bite that he sustained 2 days ago.  He is requesting antibiotics.  He has not had any joint pain, headaches, or fevers.  Neurological:     Mental Status: He is alert.      UC Treatments / Results  Labs (all labs ordered are listed, but only abnormal results are displayed) Labs Reviewed - No data to display  EKG   Radiology No results found.  Procedures Procedures (including critical care time)  Medications Ordered in UC Medications - No data to display  Initial Impression / Assessment and Plan / UC Course  I  have reviewed the triage vital signs and the nursing notes.  Pertinent labs & imaging results that were available during my care of the patient were reviewed by me and considered in my medical decision making (see chart for details).   Patient is a nontoxic-appearing 32 year old male presenting for evaluation of 2 separate complaints.  Patient's first complaint is that he pulled a tick out of his right armpit 2 days ago and he is requesting antibiotics.  No joint pain, headaches, fever, or rashes.  Patient seen in the image above there is mild erythema but no erythema migrans rash.  Also no induration or fluctuance.  Patient's second complaint is a painful right first molar.  As you can see in image above there is marked decay of the tooth.  The surrounding gum tissue is free of erythema and there is no appreciable drainage.  No submandibular or submental fullness and no facial swelling.  I will discharge patient home on doxycycline 100 mg twice daily to treat him for presumptive dental abscess as well as to cover his tick exposure.  He has been using over-the-counter Tylenol and ibuprofen which has been improving his pain and I encouraged him to continue doing the same.  Also he should rinse after meals to remove any food particles and prevent worsening of his dental issues.  I have advised him that if he develops any facial swelling, increase in pain, pus drainage, or fever that he should follow-up at the [facility] ER where he can be evaluated by the dentist on-call as he does not have a dentist or dental insurance currently.  I also suggested that he contact [facility] and [employer] and [employer] as they both have their own in-house dental insurance programs that he may be able to afford for his dental care.   Final Clinical Impressions(s) / UC Diagnoses   Final diagnoses:  Dental infection  Tick bite, unspecified site, initial encounter     Discharge Instructions      Take the  Doxycycline twice daily with food for your dental infection and also for your Tick bite.  Rinse after meals to was away drainage and remove any food particles from your infected tooth that could worsen your symptoms.  Use OTC Tylenol and Ibuprofen as needed for pain.  If you develop any facial swelling, fever, or increased pain I recommend that you go to the ER at [facility] to be evaluated by the dentist on call.      ED Prescriptions     Medication Sig Dispense Auth. Provider   doxycycline (VIBRAMYCIN) 100 MG capsule Take 1 capsule (100 mg total) by mouth 2 (two) times daily. 20 capsule Becky Augusta, NP      PDMP not reviewed this encounter.   Becky Augusta, NP 08/14/22 1134

## 2022-09-29 ENCOUNTER — Ambulatory Visit
Admission: EM | Admit: 2022-09-29 | Discharge: 2022-09-29 | Disposition: A | Discharge status: Home / Self Care | Payer: Self-pay | Attending: Emergency Medicine | Admitting: Emergency Medicine

## 2022-09-29 DIAGNOSIS — J020 Streptococcal pharyngitis: Secondary | ICD-10-CM | POA: Insufficient documentation

## 2022-09-29 LAB — GROUP A STREP BY PCR: Group A Strep by PCR: DETECTED — AB

## 2022-09-29 MED ORDER — AMOXICILLIN-POT CLAVULANATE 875-125 MG PO TABS: 1.0000 | ORAL_TABLET | Freq: Two times a day (BID) | ORAL | 0 refills | Status: AC

## 2022-09-29 NOTE — ED Provider Notes (Signed)
[facility]    CSN: [id number] Arrival date & time: 09/29/22  1849      History   Chief Complaint Chief Complaint  Patient presents with   Sore Throat    HPI Dave Rivera. is a 32 y.o. male.   HPI  32 year old male with a past medical history significant for ADD and ODD presents for evaluation of subjective fever with chills and sore throat that started yesterday.  His son was diagnosed and treated with strep 2 weeks ago.  He denies any runny nose or nasal congestion.  Past Medical History:  Diagnosis Date   ADD (attention deficit disorder)    ODD (oppositional defiant disorder)     There are no problems to display for this patient.   Past Surgical History:  Procedure Laterality Date   WRIST ARTHROPLASTY         Home Medications    Prior to Admission medications   Medication Sig Start Date End Date Taking? Authorizing Provider  amoxicillin-clavulanate (AUGMENTIN) 875-125 MG tablet Take 1 tablet by mouth every 12 (twelve) hours for 10 days. 09/29/22 10/09/22 Yes Becky Augusta, NP    Family History Family History  Problem Relation Age of Onset   Lupus Father     Social History Social History   Tobacco Use   Smoking status: Every Day    Packs/day: 1    Types: Cigarettes   Smokeless tobacco: Never  Vaping Use   Vaping Use: Never used  Substance Use Topics   Alcohol use: No   Drug use: Yes    Types: Marijuana    Comment: last use last night     Allergies   Patient has no known allergies.   Review of Systems Review of Systems  Constitutional:  Positive for chills and fever.  HENT:  Positive for sore throat. Negative for congestion and rhinorrhea.      Physical Exam Triage Vital Signs ED Triage Vitals  Enc Vitals Group     BP      Pulse      Resp      Temp      Temp src      SpO2      Weight      Height      Head Circumference      Peak Flow      Pain Score      Pain Loc      Pain Edu?      Excl. in GC?    No  data found.  Updated Vital Signs BP (!) 145/80 (BP Location: Left Arm)   Pulse (!) 105   Temp 99.4 F (37.4 C)   Ht 6\' 1"  (1.854 m)   Wt 220 lb (99.8 kg)   SpO2 96%   BMI 29.03 kg/m   Visual Acuity Right Eye Distance:   Left Eye Distance:   Bilateral Distance:    Right Eye Near:   Left Eye Near:    Bilateral Near:     Physical Exam Vitals and nursing note reviewed.  Constitutional:      Appearance: Normal appearance. He is not ill-appearing.  HENT:     Head: Normocephalic and atraumatic.     Mouth/Throat:     Mouth: Mucous membranes are moist.     Pharynx: Oropharyngeal exudate and posterior oropharyngeal erythema present.     Comments: Lateral tonsillar pillars are edematous and erythematous with white exudate on both tonsils. Musculoskeletal:  Cervical back: Normal range of motion and neck supple.  Lymphadenopathy:     Cervical: No cervical adenopathy.  Skin:    General: Skin is warm and dry.     Capillary Refill: Capillary refill takes less than 2 seconds.  Neurological:     General: No focal deficit present.     Mental Status: He is alert and oriented to person, place, and time.      UC Treatments / Results  Labs (all labs ordered are listed, but only abnormal results are displayed) Labs Reviewed  GROUP A STREP BY PCR - Abnormal; Notable for the following components:      Result Value   Group A Strep by PCR DETECTED (*)    All other components within normal limits    EKG   Radiology No results found.  Procedures Procedures (including critical care time)  Medications Ordered in UC Medications - No data to display  Initial Impression / Assessment and Plan / UC Course  I have reviewed the triage vital signs and the nursing notes.  Pertinent labs & imaging results that were available during my care of the patient were reviewed by me and considered in my medical decision making (see chart for details).   Patient is a nontoxic-appearing  32 year old male presenting for evaluation of sore throat and isolation without associated upper or lower respiratory symptoms.  Patient did have a recent strep exposure as his son was treated for strep within the last 2 weeks.  The patient is also reporting that his 44-year-old has grossly edematous tonsils that present though they are not red and he is not running a fever.  I will order a strep PCR to evaluate for the presence of strep.  Strep PCR is positive.  I will discharge patient home with a diagnosis of strep pharyngitis and treated with Augmentin 875 twice daily for 10 days.  Over-the-counter Tylenol and ibuprofen as needed for pain along with Chloraseptic or Sucrets lozenges and salt water gargles.  Work note provided.   Final Clinical Impressions(s) / UC Diagnoses   Final diagnoses:  Strep pharyngitis     Discharge Instructions      Take the Augmentin twice daily for 10 days for treatment of your strep throat.  Gargle with warm salt water 2-3 times a day to soothe your throat, aid in pain relief, and aid in healing.  Take over-the-counter ibuprofen according to the package instructions as needed for pain.  You can also use Chloraseptic or Sucrets lozenges, 1 lozenge every 2 hours as needed for throat pain.  If you develop any new or worsening symptoms return for reevaluation.      ED Prescriptions     Medication Sig Dispense Auth. Provider   amoxicillin-clavulanate (AUGMENTIN) 875-125 MG tablet Take 1 tablet by mouth every 12 (twelve) hours for 10 days. 20 tablet Becky Augusta, NP      PDMP not reviewed this encounter.   Becky Augusta, NP 09/29/22 1926

## 2022-09-29 NOTE — ED Triage Notes (Addendum)
Patient reports that he thinks he might have strep due to his kids having it. Patient reports sore throat since yesterday. Fevers with chills.

## 2022-09-29 NOTE — Discharge Instructions (Signed)
Take the Augmentin twice daily for 10 days for treatment of your strep throat.  Gargle with warm salt water 2-3 times a day to soothe your throat, aid in pain relief, and aid in healing.  Take over-the-counter ibuprofen according to the package instructions as needed for pain.  You can also use Chloraseptic or Sucrets lozenges, 1 lozenge every 2 hours as needed for throat pain.  If you develop any new or worsening symptoms return for reevaluation.  

## 2023-06-05 ENCOUNTER — Ambulatory Visit
Admission: EM | Admit: 2023-06-05 | Discharge: 2023-06-05 | Disposition: A | Discharge status: Home / Self Care | Payer: Self-pay | Attending: Family Medicine | Admitting: Family Medicine

## 2023-06-05 ENCOUNTER — Encounter: Payer: Self-pay | Admitting: Family Medicine

## 2023-06-05 DIAGNOSIS — Z599 Problem related to housing and economic circumstances, unspecified: Secondary | ICD-10-CM

## 2023-06-05 DIAGNOSIS — H00022 Hordeolum internum right lower eyelid: Secondary | ICD-10-CM

## 2023-06-05 DIAGNOSIS — K029 Dental caries, unspecified: Secondary | ICD-10-CM

## 2023-06-05 MED ORDER — AMOXICILLIN-POT CLAVULANATE 875-125 MG PO TABS: 1.0000 | ORAL_TABLET | Freq: Two times a day (BID) | ORAL | 0 refills | Status: AC

## 2023-06-05 MED ORDER — ERYTHROMYCIN 5 MG/GM OP OINT: TOPICAL_OINTMENT | OPHTHALMIC | 0 refills | Status: AC

## 2023-06-05 NOTE — ED Provider Notes (Signed)
 [facility]    CSN: [id number] Arrival date & time: 06/05/23  0840      History   Chief Complaint Chief Complaint  Patient presents with   Stye    HPI HPI  Dave Rivera. is a 33 y.o. male.    Dave Rivera presents for right eye pain, redness and swelling that started 2 days ago.  Reports falling asleep in a hot bathe and the white pus forming in his inner eyelid.  Dave Rivera does not wear glasses or contacts.  Dave Rivera has not had any trouble seeing. Last weekend, he was cleaning out someone's gutters and a lot of "trash" flew into his eye. Says he felt like he "needed to be wearing a hazmat suit."    Has a few broken teeth that have been hurting.  He has been squeezing pus out of the area with a Qtip.  Doesn't have a Education Rivera, community or dental insurance. He doesn't brush his teeth regularly but his kids are teaching him dental hygiene.    Dave Rivera has otherwise been well and has no additional concerns today.    Past Medical History:  Diagnosis Date   ADD (attention deficit disorder)    ODD (oppositional defiant disorder)     There are no active problems to display for this patient.   Past Surgical History:  Procedure Laterality Date   WRIST ARTHROPLASTY         Home Medications    Prior to Admission medications   Medication Sig Start Date End Date Taking? Authorizing Provider  amoxicillin-clavulanate (AUGMENTIN) 875-125 MG tablet Take 1 tablet by mouth every 12 (twelve) hours. 06/05/23  Yes Dave Rivera, Dave Meth, DO  erythromycin ophthalmic ointment Place a 1/2 inch ribbon of ointment into the lower eyelid. Three times a day for the next 7 days. 06/05/23  Yes Dave Cabal, DO    Family History Family History  Problem Relation Age of Onset   Lupus Father     Social History Social History   Tobacco Use   Smoking status: Every Day    Current packs/day: 1.00    Types: Cigarettes   Smokeless tobacco: Never  Vaping Use   Vaping status: Never Used  Substance Use  Topics   Alcohol use: No   Drug use: Yes    Types: Marijuana    Comment: last use last night     Allergies   Patient has no known allergies.   Review of Systems Review of Systems : negative unless otherwise stated in HPI.      Physical Exam Triage Vital Signs ED Triage Vitals  Encounter Vitals Group     BP      Systolic BP Percentile      Diastolic BP Percentile      Pulse      Resp      Temp      Temp src      SpO2      Weight      Height      Head Circumference      Peak Flow      Pain Score      Pain Loc      Pain Education      Exclude from Growth Chart    No data found.  Updated Vital Signs BP 127/89 (BP Location: Left Arm)   Pulse 100   Temp 98.4 F (36.9 C) (Oral)   Resp 15   Ht 6\' 1"  (1.854 m)   Wt 99.8  kg   SpO2 96%   BMI 29.03 kg/m   Visual Acuity Right Eye Distance:   Left Eye Distance:   Bilateral Distance:    Right Eye Near:   Left Eye Near:    Bilateral Near:     Physical Exam  GEN: pleasant well appearing male, in no acute distress    HENT:  mucus membranes moist, oropharyngeal without lesions exudates or erythema, nasal discharge, poor dentition, multiple dental caries with crack-offs, no trismus, no secretion pooling, no palpable induration and no visible swelling of the floor the mouth, normal jaw movement without difficulty EYES:   pupils equal and reactive,  General: Lids are normal. Lids are everted, no foreign bodies appreciated. Vision grossly intact. Gaze aligned appropriately.        Right eye: purulent white discharge in lower lid, internal lower hordeolum     Left eye: No foreign body, discharge or hordeolum.     Extraocular Movements: Extraocular movements intact.     Conjunctiva/sclera: right conjunctiva is injected. No chemosis or hemorrhage. RESP:  no increased work of breathing CVS:   regular rate  Skin:   warm and dry, no rash or skin changes of on external jaw    UC Treatments / Results  Labs (all labs  ordered are listed, but only abnormal results are displayed) Labs Reviewed - No data to display  EKG   Radiology No results found.  Procedures Procedures (including critical care time)  Medications Ordered in UC Medications - No data to display  Initial Impression / Assessment and Plan / UC Course  I have reviewed the triage vital signs and the nursing notes.  Pertinent labs & imaging results that were available during my care of the patient were reviewed by me and considered in my medical decision making (see chart for details).     Patient is a 33 y.o. male who presents after  right eye pain with erythema, edema and discharge for the past  2 days.  On exam, he has a evidence of internal lower hordeolum on the right. Treat with erythromycin ointment.  Advised to follow-up with an ophthalmologist or optometrist, if  discomfort/pain is not improving after 7 day course.  [name] is afebrile here without recent antipyretics. Satting well on room air. Overall pt is well appearing, well hydrated, without respiratory distress.  Dental exam concerning for dental abscess. Augmentin BID for 7 days as below.  - continue Tylenol with Motrin as needed for discomfort - Gargle with salt water several times a day - Dental resource handout provided  - Establish care with a dentist.   - Discussed  ED precautions, understanding voiced.   Discussed MDM, treatment plan and plan for follow-up with patient who agrees with plan.  Final Clinical Impressions(s) / UC Diagnoses   Final diagnoses:  Hordeolum internum of right lower eyelid  Pain due to dental caries  Financial difficulties     Discharge Instructions      Stop by the pharmacy to pick up your antibiotic eye medication.  Follow up with your primary eyecare provider or [facility] if symptoms suddenly worsen or you have little improvement in your eye symptoms.   Follow up with a local dentist regarding.  See dental resource  handout.      ED Prescriptions     Medication Sig Dispense Auth. Provider   erythromycin ophthalmic ointment Place a 1/2 inch ribbon of ointment into the lower eyelid. Three times a day for the next  7 days. 3.5 g Dave Merryfield, DO   amoxicillin-clavulanate (AUGMENTIN) 875-125 MG tablet Take 1 tablet by mouth every 12 (twelve) hours. 14 tablet Dave Rivera, Dave Meth, DO      PDMP not reviewed this encounter.   Dave Cabal, DO 06/05/23 [id number]

## 2023-06-05 NOTE — ED Triage Notes (Signed)
 Patient reports red swollen bump on his right lower eye lid for the past 2 days.  Patient also reports having 2 broken teeth.

## 2023-06-05 NOTE — Discharge Instructions (Signed)
 Stop by the pharmacy to pick up your antibiotic eye medication.  Follow up with your primary eyecare provider or [facility] if symptoms suddenly worsen or you have little improvement in your eye symptoms.   Follow up with a local dentist regarding.  See dental resource handout.

## 2023-09-13 IMAGING — CT CT ANGIO CHEST
2 of 7 series · 17 of 46 positions shown · IV contrast (APPLIED)
Comparison: Chest radiograph earlier today.

CLINICAL DATA: Pulmonary embolism (PE) suspected, high prob

Left-sided chest pain, intermittent for 1 month. Shortness of breath
EXAM:
CT ANGIOGRAPHY CHEST WITH CONTRAST
TECHNIQUE: Multidetector CT imaging of the chest was performed using the
standard protocol during bolus administration of intravenous
contrast. Multiplanar CT image reconstructions and MIPs were
obtained to evaluate the vascular anatomy.

[Series 6: thins · axial · 0.76mm/px · z∈[-718,-446]mm · 14 of 438 slices shown]
[im 25/438  lung]
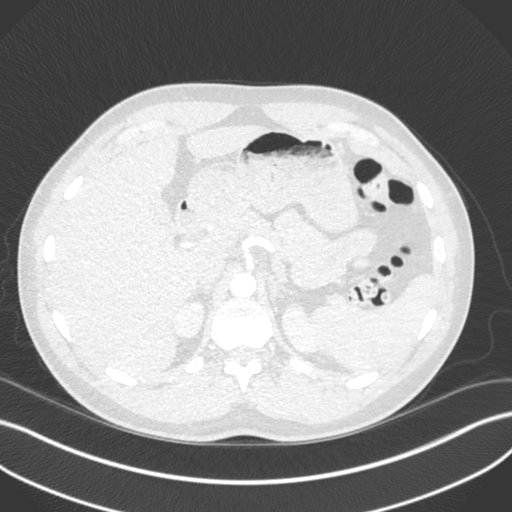
[im 49/438  soft-tissue]
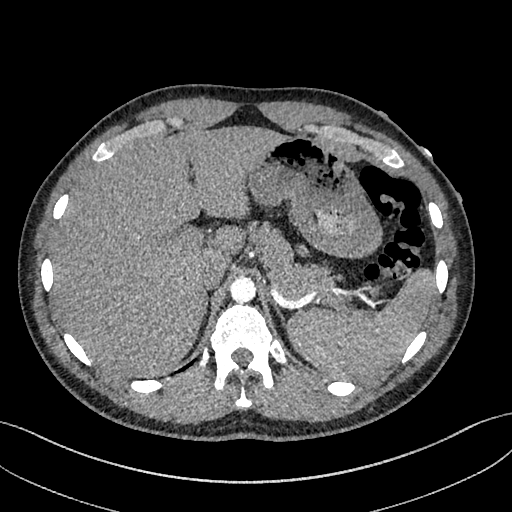
[im 98/438  lung]
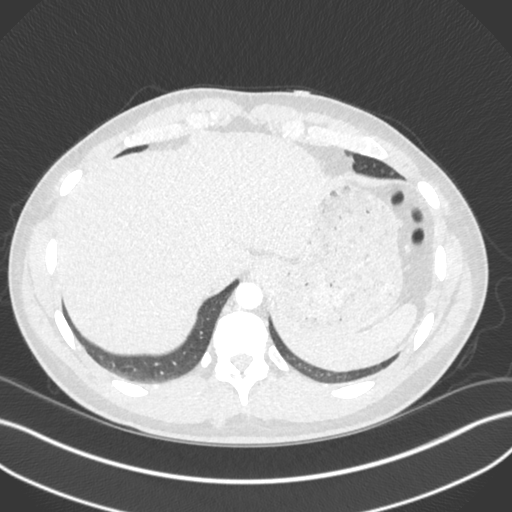
[im 122/438  soft-tissue]
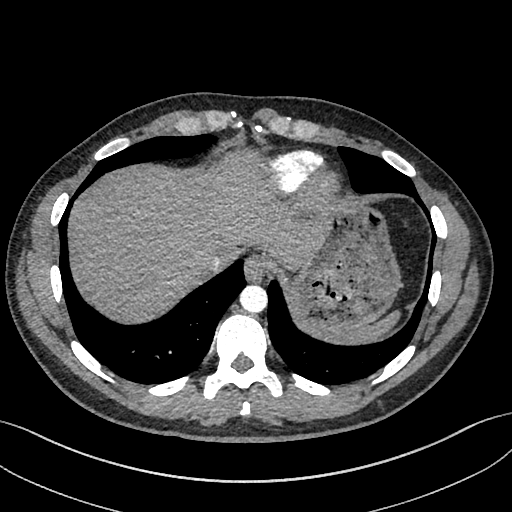
[im 146/438  lung]
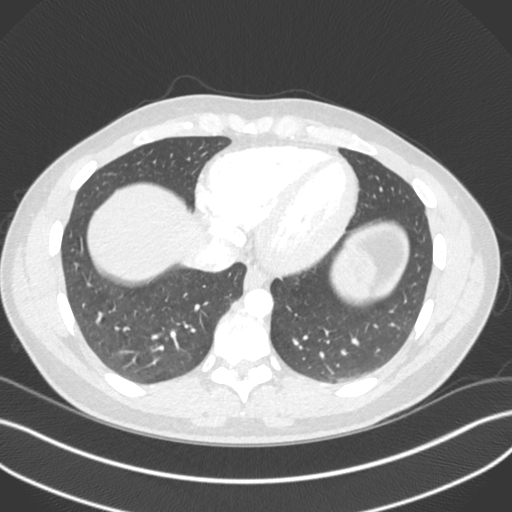
[im 170/438  soft-tissue]
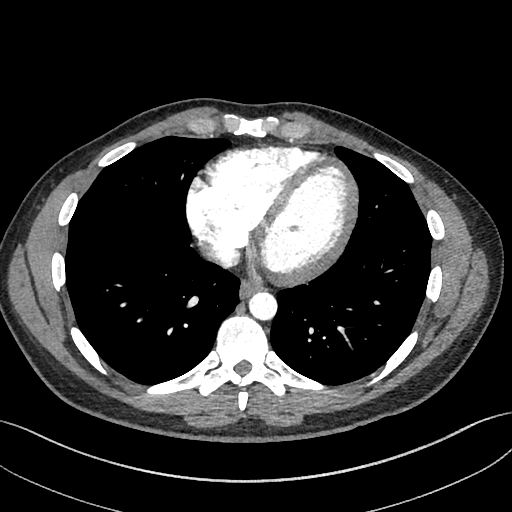
[im 195/438  lung]
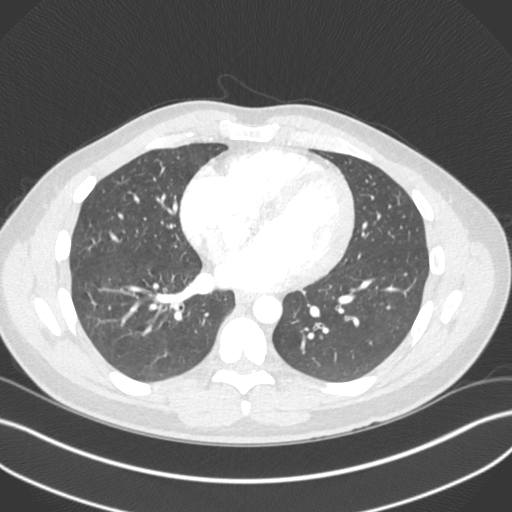
[im 243/438  soft-tissue]
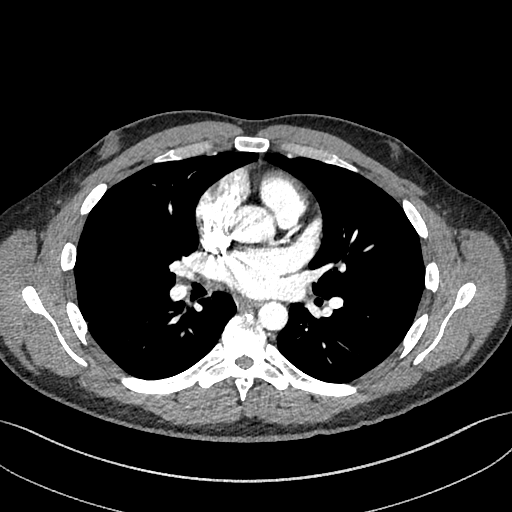
[im 268/438  lung]
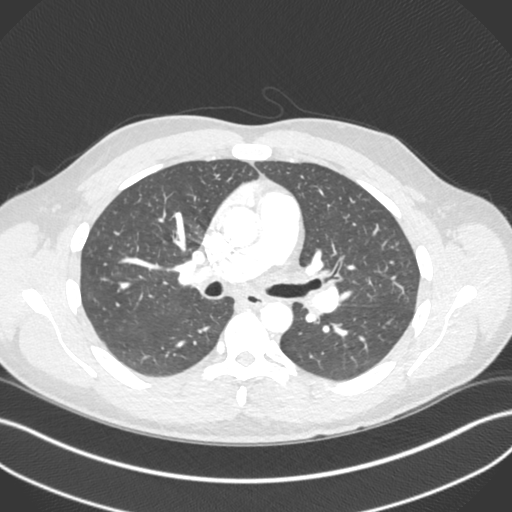
[im 292/438  soft-tissue]
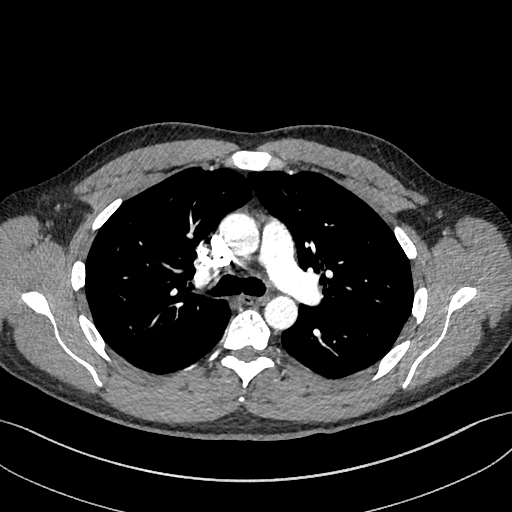
[im 316/438  lung]
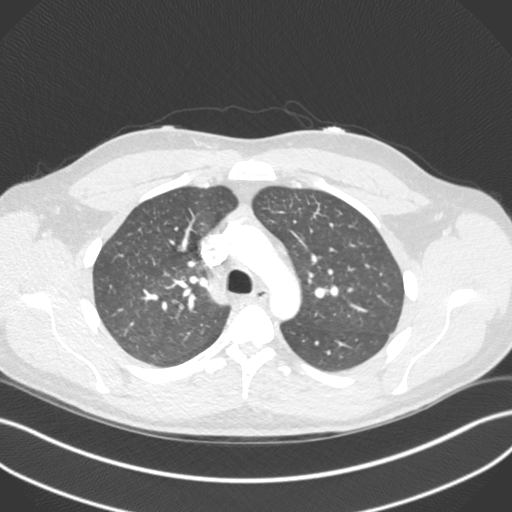
[im 340/438  soft-tissue]
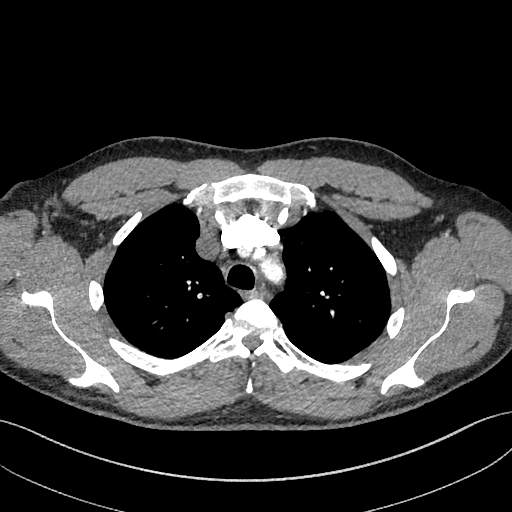
[im 389/438  lung]
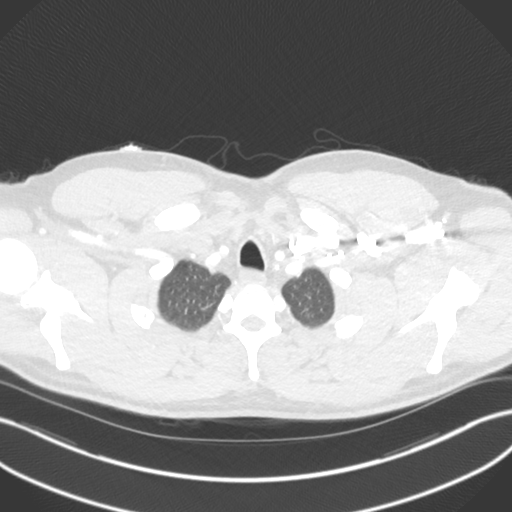
[im 413/438  soft-tissue]
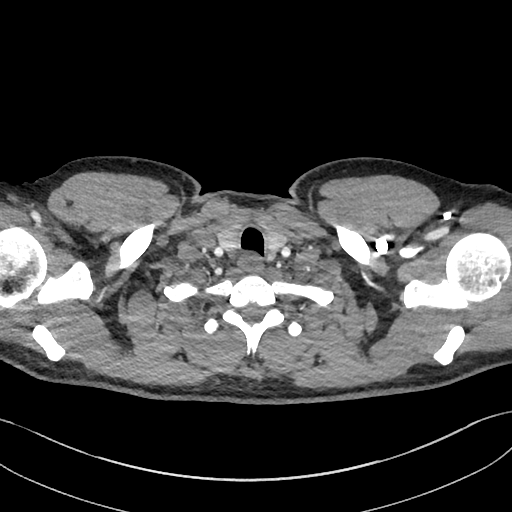

[Series 7: cor · coronal · 0.62mm/px · 3 of 132 slices shown]
[im 33/132  soft-tissue]
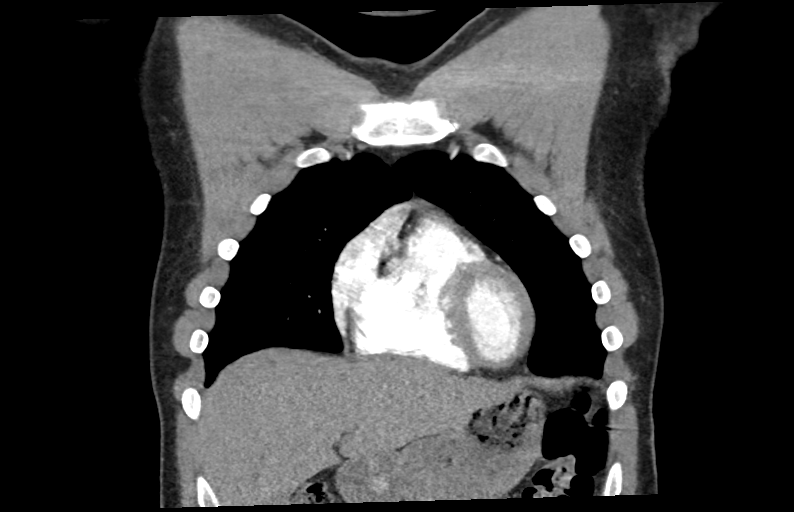
[im 66/132  soft-tissue]
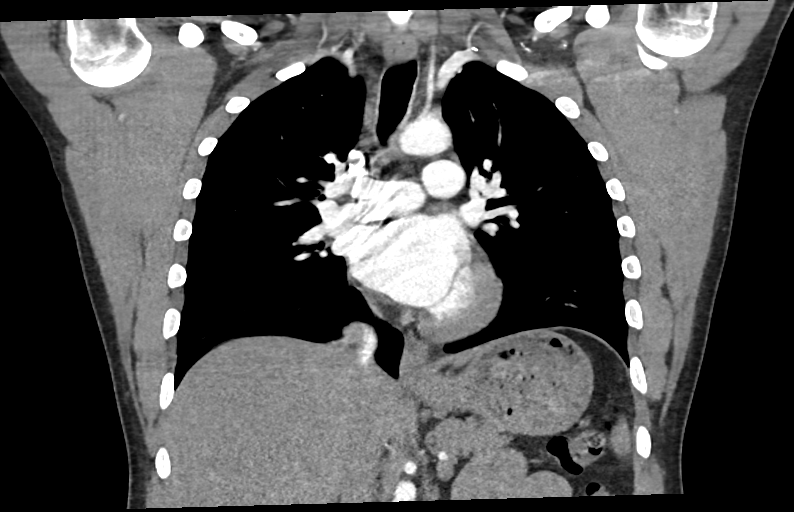
[im 99/132  soft-tissue]
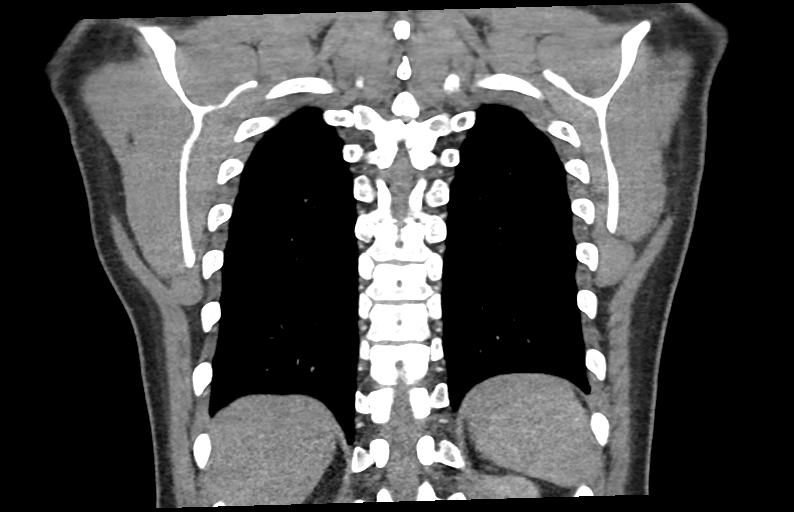

[17 of 46 positions shown; findings below may reference images not displayed]

RADIATION DOSE REDUCTION: This exam was performed according to the
departmental dose-optimization program which includes automated
exposure control, adjustment of the mA and/or kV according to
patient size and/or use of iterative reconstruction technique.

CONTRAST:  75mL OMNIPAQUE IOHEXOL 350 MG/ML SOLN
FINDINGS: Cardiovascular: There are no filling defects within the pulmonary
arteries to suggest pulmonary embolus. The thoracic aorta is normal
in caliber. No dissection or acute aortic findings. The left
vertebral artery arises directly from the thoracic aorta, variant
arch anatomy. The heart is normal in size. No pericardial effusion.

Mediastinum/Nodes: No enlarged mediastinal or hilar lymph nodes. No
axillary adenopathy. No thyroid nodule. The esophagus is
decompressed.

Lungs/Pleura: Moderate bronchial wall thickening and heterogeneous
pulmonary parenchyma. Minor subsegmental atelectasis in the left
lower lobe, no confluent airspace disease. No pleural effusion. No
findings of pulmonary edema. No pulmonary nodule.

Upper Abdomen: No acute or unexpected findings. Homogeneous
attenuation of the liver and spleen.

Musculoskeletal: There are no acute or suspicious osseous
abnormalities. No rib fracture or rib abnormality to account for
chest pain. No chest wall soft tissue abnormalities.

Review of the MIP images confirms the above findings.
IMPRESSION: 1. No pulmonary embolus.
2. Moderate bronchial wall thickening and heterogeneous pulmonary
parenchyma, may be related to smoking, bronchitis or asthma.
